# Patient Record
Sex: Female | Born: 1965 | Race: White | Hispanic: No | Marital: Married | Smoking: Former smoker
Health system: Southern US, Community
[De-identification: ages and names within clinical notes are randomized; demographics above are authoritative.]

## PROBLEM LIST (undated history)

## (undated) DIAGNOSIS — G8929 Other chronic pain: Secondary | ICD-10-CM

## (undated) DIAGNOSIS — Z8489 Family history of other specified conditions: Secondary | ICD-10-CM

## (undated) DIAGNOSIS — M549 Dorsalgia, unspecified: Secondary | ICD-10-CM

## (undated) DIAGNOSIS — M1990 Unspecified osteoarthritis, unspecified site: Secondary | ICD-10-CM

## (undated) HISTORY — PX: TONSILLECTOMY: SUR1361

## (undated) HISTORY — PX: OTHER SURGICAL HISTORY: SHX169

## (undated) HISTORY — PX: TONSILECTOMY, ADENOIDECTOMY, BILATERAL MYRINGOTOMY AND TUBES: SHX2538

---

## 2005-05-07 ENCOUNTER — Ambulatory Visit: Payer: Self-pay | Admitting: Obstetrics and Gynecology

## 2005-11-21 ENCOUNTER — Emergency Department: Payer: Self-pay | Admitting: Emergency Medicine

## 2007-05-25 ENCOUNTER — Ambulatory Visit: Payer: Self-pay | Admitting: Obstetrics and Gynecology

## 2007-05-31 ENCOUNTER — Ambulatory Visit: Payer: Self-pay | Admitting: Obstetrics and Gynecology

## 2007-09-30 ENCOUNTER — Ambulatory Visit: Payer: Self-pay | Admitting: Obstetrics and Gynecology

## 2008-01-29 ENCOUNTER — Emergency Department: Payer: Self-pay | Admitting: Emergency Medicine

## 2009-02-01 ENCOUNTER — Ambulatory Visit: Payer: Self-pay | Admitting: Obstetrics and Gynecology

## 2010-03-14 ENCOUNTER — Ambulatory Visit: Payer: Self-pay | Admitting: Obstetrics and Gynecology

## 2011-03-19 ENCOUNTER — Ambulatory Visit: Payer: Self-pay | Admitting: Obstetrics & Gynecology

## 2014-12-28 ENCOUNTER — Emergency Department
Admission: EM | Admit: 2014-12-28 | Discharge: 2014-12-28 | Disposition: A | Discharge status: Home / Self Care | Payer: 59 | Attending: Emergency Medicine | Admitting: Emergency Medicine

## 2014-12-28 ENCOUNTER — Encounter: Payer: Self-pay | Admitting: Urgent Care

## 2014-12-28 DIAGNOSIS — Z720 Tobacco use: Secondary | ICD-10-CM | POA: Insufficient documentation

## 2014-12-28 DIAGNOSIS — H9201 Otalgia, right ear: Secondary | ICD-10-CM | POA: Diagnosis present

## 2014-12-28 DIAGNOSIS — H6091 Unspecified otitis externa, right ear: Secondary | ICD-10-CM | POA: Diagnosis not present

## 2014-12-28 MED ORDER — NEOMYCIN-POLYMYXIN-HC 1 % OT SOLN: 3.0000 [drp] | Freq: Four times a day (QID) | OTIC | Status: DC

## 2014-12-28 NOTE — Discharge Instructions (Signed)

## 2014-12-28 NOTE — ED Notes (Signed)
Patient presents with a RIGHT side otalgia since yesterday. Has been experiencing a "sloshing" sound in her ear since Saturday. Patient "did a lot of ocean and pool swimming in [place]" on Thursday - attributes her symptoms to this activity. Denies fever and drainage from ear.

## 2014-12-28 NOTE — ED Provider Notes (Signed)
[facility] Emergency Department Provider Note  ____________________________________________  Time seen: Approximately 9:33 PM  I have reviewed the triage vital signs and the nursing notes.   HISTORY  Chief Complaint Otalgia    HPI Summit Borchardt is a 49 y.o. female resistance to the emergency department complaining of right sided ear pain for several days. She states that she was in [place] last week and spent a lot of time in the ocean and pull swelling. She states that she initially felt some "sloshing" sensation in her right ear. She thought that she had thoroughly dried out her ear using hydrogen peroxide, and Q-tips. She states that the pain initially began 3 days ago, resolved, and then returned yesterday. Patient reports the pain has been increasing today. Patient denies any other symptoms.   History reviewed. No pertinent past medical history.  There are no active problems to display for this patient.   No past surgical history on file.  Current Outpatient Rx  Name  Route  Sig  Dispense  Refill  . NEOMYCIN-POLYMYXIN-HYDROCORTISONE (CORTISPORIN) 1 % SOLN otic solution   Right Ear   Place 3 drops into the right ear 4 (four) times daily.   10 mL   0     Allergies Review of patient's allergies indicates not on file.  No family history on file.  Social History Social History  Substance Use Topics  . Smoking status: Current Every Day Smoker  . Smokeless tobacco: None  . Alcohol Use: Yes    Review of Systems Constitutional: No fever/chills Eyes: No visual changes. ENT: No sore throat. Patient endorses right ear pain. Cardiovascular: Denies chest pain. Respiratory: Denies shortness of breath. Gastrointestinal: No abdominal pain.  No nausea, no vomiting.  No diarrhea.  No constipation. Genitourinary: Negative for dysuria. Musculoskeletal: Negative for back pain. Skin: Negative for rash. Neurological: Negative for headaches, focal  weakness or numbness.  10-point ROS otherwise negative.  ____________________________________________   PHYSICAL EXAM:  VITAL SIGNS: ED Triage Vitals  Enc Vitals Group     BP 12/28/14 2130 127/75 mmHg     Pulse --      Resp 12/28/14 2130 16     Temp 12/28/14 2130 97.4 F (36.3 C)     Temp Source 12/28/14 2130 Oral     SpO2 12/28/14 2130 97 %     Weight 12/28/14 2130 150 lb (68.04 kg)     Height 12/28/14 2130  (1.676 m)     Head Cir --      Peak Flow --      Pain Score 12/28/14 2132 5     Pain Loc --      Pain Edu? --      Excl. in GC? --     Constitutional: Alert and oriented. Well appearing and in no acute distress. Eyes: Conjunctivae are normal. PERRL. EOMI. Head: Atraumatic. Nose: No congestion/rhinnorhea. Ears: Left external ear with no abnormality. Left external ear canal with no erythema or edema. Left TM with no bulging and landmarks visible. Right external ear tenderness to palpation of the tragus. Right external auditory canal erythematous and edematous. Mild cerumen noted but not impacted. TM visible beyond cerumen with no signs of erythema, bulging, and landmarks are visible. Mouth/Throat: Mucous membranes are moist.  Oropharynx non-erythematous. Neck: No stridor.   Cardiovascular: Normal rate, regular rhythm. Grossly normal heart sounds.  Good peripheral circulation. Respiratory: Normal respiratory effort.  No retractions. Lungs CTAB. Gastrointestinal: Soft and nontender. No distention. No abdominal  bruits. No CVA tenderness. Musculoskeletal: No lower extremity tenderness nor edema.  No joint effusions. Neurologic:  Normal speech and language. No gross focal neurologic deficits are appreciated. No gait instability. Skin:  Skin is warm, dry and intact. No rash noted. Psychiatric: Mood and affect are normal. Speech and behavior are normal.  ____________________________________________   LABS (all labs ordered are listed, but only abnormal results are  displayed)  Labs Reviewed - No data to display ____________________________________________  EKG   ____________________________________________  RADIOLOGY   ____________________________________________   PROCEDURES  Procedure(s) performed: None  Critical Care performed: No  ____________________________________________   INITIAL IMPRESSION / ASSESSMENT AND PLAN / ED COURSE  Pertinent labs & imaging results that were available during my care of the patient were reviewed by me and considered in my medical decision making (see chart for details).  Patient's history, symptoms, and exam most consistent with otitis externa. Explained to patient diagnosis. She verbalizes understanding. Explained treatment course to include antibiotic eardrops, Tylenol, ibuprofen, acetic acid. Advised patient against using any Q-tips in the ear. Patient verbalizes understanding of same and verbalizes compliance. Patient will follow up with primary care for any continued symptoms past treatment course. ____________________________________________   FINAL CLINICAL IMPRESSION(S) / ED DIAGNOSES  Final diagnoses:  Otitis externa, right      Racheal Patches, PA-C 12/28/14 2154  Emily Filbert, MD 12/28/14 2206

## 2015-05-27 ENCOUNTER — Encounter (HOSPITAL_COMMUNITY): Payer: Self-pay

## 2015-05-27 ENCOUNTER — Emergency Department (HOSPITAL_COMMUNITY)
Admission: EM | Admit: 2015-05-27 | Discharge: 2015-05-27 | Disposition: A | Discharge status: Home / Self Care | Payer: Managed Care, Other (non HMO) | Attending: Emergency Medicine | Admitting: Emergency Medicine

## 2015-05-27 DIAGNOSIS — Z792 Long term (current) use of antibiotics: Secondary | ICD-10-CM | POA: Insufficient documentation

## 2015-05-27 DIAGNOSIS — G8929 Other chronic pain: Secondary | ICD-10-CM | POA: Insufficient documentation

## 2015-05-27 DIAGNOSIS — Z87891 Personal history of nicotine dependence: Secondary | ICD-10-CM | POA: Diagnosis not present

## 2015-05-27 DIAGNOSIS — M62830 Muscle spasm of back: Secondary | ICD-10-CM | POA: Insufficient documentation

## 2015-05-27 DIAGNOSIS — R2689 Other abnormalities of gait and mobility: Secondary | ICD-10-CM | POA: Diagnosis not present

## 2015-05-27 DIAGNOSIS — M545 Low back pain: Secondary | ICD-10-CM | POA: Diagnosis present

## 2015-05-27 HISTORY — DX: Dorsalgia, unspecified: M54.9

## 2015-05-27 HISTORY — DX: Other chronic pain: G89.29

## 2015-05-27 MED ORDER — DIAZEPAM 5 MG PO TABS: 5.0000 mg | ORAL_TABLET | Freq: Two times a day (BID) | ORAL | Status: DC | PRN

## 2015-05-27 MED ORDER — DIAZEPAM 5 MG PO TABS
5.0000 mg | ORAL_TABLET | Freq: Once | ORAL | Status: AC
  Administered 2015-05-27: 5 mg via ORAL
  Filled 2015-05-27: qty 1

## 2015-05-27 MED ORDER — KETOROLAC TROMETHAMINE 30 MG/ML IJ SOLN
30.0000 mg | Freq: Once | INTRAMUSCULAR | Status: AC
  Administered 2015-05-27: 30 mg via INTRAMUSCULAR
  Filled 2015-05-27: qty 1

## 2015-05-27 MED ORDER — KETOROLAC TROMETHAMINE 30 MG/ML IJ SOLN: 30.0000 mg | Freq: Once | INTRAMUSCULAR | Status: DC

## 2015-05-27 MED ORDER — OXYCODONE-ACETAMINOPHEN 5-325 MG PO TABS
1.0000 | ORAL_TABLET | Freq: Once | ORAL | Status: AC
  Administered 2015-05-27: 1 via ORAL

## 2015-05-27 MED ORDER — MORPHINE SULFATE (PF) 4 MG/ML IV SOLN
4.0000 mg | Freq: Once | INTRAVENOUS | Status: AC
  Administered 2015-05-27: 4 mg via INTRAMUSCULAR
  Filled 2015-05-27: qty 1

## 2015-05-27 MED ORDER — OXYCODONE-ACETAMINOPHEN 5-325 MG PO TABS
ORAL_TABLET | ORAL | Status: AC
  Filled 2015-05-27: qty 1

## 2015-05-27 NOTE — ED Notes (Signed)
Patient left at this time with all belongings. 

## 2015-05-27 NOTE — Discharge Instructions (Signed)
You were seen in the ED for low back pain.  Your neurologic exam was normal.  Your back exam showed increased tonicity of your lumbar paraspinal musculature, suggestive of muscle spasm.  You are being discharged with antispasmodic medication.  Please take this medication with caution, as it can be sedating.  Do not drink alcohol or drive/ operate heavy machinery while taking this medication.  You should continue to be as mobile as possible.  This will reduce the duration of your back spasm.  You voiced concern regarding possible herniated disc.  The treatment for that would be Physical therapy and Nonsteroidal anti-inflammatory medications or elective surgery if appropriate.  You should establish with a primary care provider if you are interested in elective surgery or back injections.  They will be able to refer you to an orthopedist or neurosurgeon.     Back Exercises If you have pain in your back, do these exercises 2-3 times each day or as told by your doctor. When the pain goes away, do the exercises once each day, but repeat the steps more times for each exercise (do more repetitions). If you do not have pain in your back, do these exercises once each day or as told by your doctor. EXERCISES Single Knee to Chest Do these steps 3-5 times in a row for each leg:  Lie on your back on a firm bed or the floor with your legs stretched out.  Bring one knee to your chest.  Hold your knee to your chest by grabbing your knee or thigh.  Pull on your knee until you feel a gentle stretch in your lower back.  Keep doing the stretch for 10-30 seconds.  Slowly let go of your leg and straighten it. Pelvic Tilt Do these steps 5-10 times in a row:  Lie on your back on a firm bed or the floor with your legs stretched out.  Bend your knees so they point up to the ceiling. Your feet should be flat on the floor.  Tighten your lower belly (abdomen) muscles to press your lower back against the floor. This  will make your tailbone point up to the ceiling instead of pointing down to your feet or the floor.  Stay in this position for 5-10 seconds while you gently tighten your muscles and breathe evenly. Cat-Cow Do these steps until your lower back bends more easily:  Get on your hands and knees on a firm surface. Keep your hands under your shoulders, and keep your knees under your hips. You may put padding under your knees.  Let your head hang down, and make your tailbone point down to the floor so your lower back is round like the back of a cat.  Stay in this position for 5 seconds.  Slowly lift your head and make your tailbone point up to the ceiling so your back hangs low (sags) like the back of a cow.  Stay in this position for 5 seconds. Press-Ups Do these steps 5-10 times in a row:  Lie on your belly (face-down) on the floor.  Place your hands near your head, about shoulder-width apart.  While you keep your back relaxed and keep your hips on the floor, slowly straighten your arms to raise the top half of your body and lift your shoulders. Do not use your back muscles. To make yourself more comfortable, you may change where you place your hands.  Stay in this position for 5 seconds.  Slowly return to lying  flat on the floor. Bridges Do these steps 10 times in a row:  Lie on your back on a firm surface.  Bend your knees so they point up to the ceiling. Your feet should be flat on the floor.  Tighten your butt muscles and lift your butt off of the floor until your waist is almost as high as your knees. If you do not feel the muscles working in your butt and the back of your thighs, slide your feet 1-2 inches farther away from your butt.  Stay in this position for 3-5 seconds.  Slowly lower your butt to the floor, and let your butt muscles relax. If this exercise is too easy, try doing it with your arms crossed over your chest. Belly Crunches Do these steps 5-10 times in a  row:  Lie on your back on a firm bed or the floor with your legs stretched out.  Bend your knees so they point up to the ceiling. Your feet should be flat on the floor.  Cross your arms over your chest.  Tip your chin a little bit toward your chest but do not bend your neck.  Tighten your belly muscles and slowly raise your chest just enough to lift your shoulder blades a tiny bit off of the floor.  Slowly lower your chest and your head to the floor. Back Lifts Do these steps 5-10 times in a row: 1. Lie on your belly (face-down) with your arms at your sides, and rest your forehead on the floor. 2. Tighten the muscles in your legs and your butt. 3. Slowly lift your chest off of the floor while you keep your hips on the floor. Keep the back of your head in line with the curve in your back. Look at the floor while you do this. 4. Stay in this position for 3-5 seconds. 5. Slowly lower your chest and your face to the floor. GET HELP IF:  Your back pain gets a lot worse when you do an exercise.  Your back pain does not lessen 2 hours after you exercise. If you have any of these problems, stop doing the exercises. Do not do them again unless your doctor says it is okay. GET HELP RIGHT AWAY IF:  You have sudden, very bad back pain. If this happens, stop doing the exercises. Do not do them again unless your doctor says it is okay.   This information is not intended to replace advice given to you by your health care provider. Make sure you discuss any questions you have with your health care provider.   Document Released: 04/20/2010 Document Revised: 12/07/2014 Document Reviewed: 05/12/2014 Elsevier Interactive Patient Education 2016 [place] Injury Prevention Back injuries can be very painful. They can also be difficult to heal. After having one back injury, you are more likely to injure your back again. It is important to learn how to avoid injuring or re-injuring your back.  The following tips can help you to prevent a back injury. WHAT SHOULD I KNOW ABOUT PHYSICAL FITNESS?  Exercise for 30 minutes per day on most days of the week or as told by your doctor. Make sure to:  Do aerobic exercises, such as walking, jogging, biking, or swimming.  Do exercises that increase balance and strength, such as tai chi and yoga.  Do stretching exercises. This helps with flexibility.  Try to develop strong belly (abdominal) muscles. Your belly muscles help to support your back.  Stay at  a healthy weight. This helps to decrease your risk of a back injury. WHAT SHOULD I KNOW ABOUT MY DIET?  Talk with your doctor about your overall diet. Take supplements and vitamins only as told by your doctor.  Talk with your doctor about how much calcium and vitamin D you need each day. These nutrients help to prevent weakening of the bones (osteoporosis).  Include good sources of calcium in your diet, such as:  Dairy products.  Green leafy vegetables.  Products that have had calcium added to them (fortified).  Include good sources of vitamin D in your diet, such as:  Milk.  Foods that have had vitamin D added to them. WHAT SHOULD I KNOW ABOUT MY POSTURE?  Sit up straight and stand up straight. Avoid leaning forward when you sit or hunching over when you stand.  Choose chairs that have good low-back (lumbar) support.  If you work at a desk, sit close to it so you do not need to lean over. Keep your chin tucked in. Keep your neck drawn back. Keep your elbows bent so your arms look like the letter "L" (right angle).  Sit high and close to the steering wheel when you drive. Add a low-back support to your car seat, if needed.  Avoid sitting or standing in one position for very long. Take breaks to get up, stretch, and walk around at least one time every hour. Take breaks every hour if you are driving for long periods of time.  Sleep on your side with your knees slightly bent, or  sleep on your back with a pillow under your knees. Do not lie on the front of your body to sleep. WHAT SHOULD I KNOW ABOUT LIFTING, TWISTING, AND REACHING Lifting and Heavy Lifting  Avoid heavy lifting, especially lifting over and over again. If you must do heavy lifting:  Stretch before lifting.  Work slowly.  Rest between lifts.  Use a tool such as a cart or a dolly to move objects if one is available.  Make several small trips instead of carrying one heavy load.  Ask for help when you need it, especially when moving big objects.  Follow these steps when lifting:  Stand with your feet shoulder-width apart.  Get as close to the object as you can. Do not pick up a heavy object that is far from your body.  Use handles or lifting straps if they are available.  Bend at your knees. Squat down, but keep your heels off the floor.  Keep your shoulders back. Keep your chin tucked in. Keep your back straight.  Lift the object slowly while you tighten the muscles in your legs, belly, and butt. Keep the object as close to the center of your body as possible.  Follow these steps when putting down a heavy load:  Stand with your feet shoulder-width apart.  Lower the object slowly while you tighten the muscles in your legs, belly, and butt. Keep the object as close to the center of your body as possible.  Keep your shoulders back. Keep your chin tucked in. Keep your back straight.  Bend at your knees. Squat down, but keep your heels off the floor.  Use handles or lifting straps if they are available. Twisting and Reaching  Avoid lifting heavy objects above your waist.  Do not twist at your waist while you are lifting or carrying a load. If you need to turn, move your feet.  Do not bend over without bending  at your knees.  Avoid reaching over your head, across a table, or for an object on a high surface.  WHAT ARE SOME OTHER TIPS?  Avoid wet floors and icy ground. Keep  sidewalks clear of ice to prevent falls.   Do not sleep on a mattress that is too soft or too hard.   Keep items that you use often within easy reach.   Put heavier objects on shelves at waist level, and put lighter objects on lower or higher shelves.  Find ways to lower your stress, such as:  Exercise.  Massage.  Relaxation techniques.  Talk with your doctor if you feel anxious or depressed. These conditions can make back pain worse.  Wear flat heel shoes with cushioned soles.  Avoid making quick (sudden) movements.  Use both shoulder straps when carrying a backpack.  Do not use any tobacco products, including cigarettes, chewing tobacco, or electronic cigarettes. If you need help quitting, ask your doctor.   This information is not intended to replace advice given to you by your health care provider. Make sure you discuss any questions you have with your health care provider.   Document Released: 09/04/2007 Document Revised: 08/02/2014 Document Reviewed: 03/22/2014 Elsevier Interactive Patient Education 2016 [place] therapy can help ease sore, stiff, injured, and tight muscles and joints. Heat relaxes your muscles, which may help ease your pain. Heat therapy should only be used on old, pre-existing, or long-lasting (chronic) injuries. Do not use heat therapy unless told by your doctor. HOW TO USE HEAT THERAPY There are several different kinds of heat therapy, including:  Moist heat pack.  Warm water bath.  Hot water bottle.  Electric heating pad.  Heated gel pack.  Heated wrap.  Electric heating pad. GENERAL HEAT THERAPY RECOMMENDATIONS   Do not sleep while using heat therapy. Only use heat therapy while you are awake.  Your skin may turn pink while using heat therapy. Do not use heat therapy if your skin turns red.  Do not use heat therapy if you have new pain.  High heat or long exposure to heat can cause burns. Be careful when  using heat therapy to avoid burning your skin.  Do not use heat therapy on areas of your skin that are already irritated, such as with a rash or sunburn. GET HELP IF:   You have blisters, redness, swelling (puffiness), or numbness.  You have new pain.  Your pain is worse. MAKE SURE YOU:  Understand these instructions.  Will watch your condition.  Will get help right away if you are not doing well or get worse.   This information is not intended to replace advice given to you by your health care provider. Make sure you discuss any questions you have with your health care provider.   Document Released: 06/10/2011 Document Revised: 04/08/2014 Document Reviewed: 05/11/2013 Elsevier Interactive Patient Education 2016 [employer].   Emergency Department Resource Guide 1) Find a Doctor and Pay Out of Pocket Although you won't have to find out who is covered by your insurance plan, it is a good idea to ask around and get recommendations. You will then need to call the office and see if the doctor you have chosen will accept you as a new patient and what types of options they offer for patients who are self-pay. Some doctors offer discounts or will set up payment plans for their patients who do not have insurance, but you will need to ask  so you aren't surprised when you get to your appointment.  2) Contact Your Local Health Department Not all health departments have doctors that can see patients for sick visits, but many do, so it is worth a call to see if yours does. If you don't know where your local health department is, you can check in your phone book. The CDC also has a tool to help you locate your state's health department, and many state websites also have listings of all of their local health departments.  3) Find a [facility] If your illness is not likely to be very severe or complicated, you may want to try a walk in clinic. These are popping up all over the country in  pharmacies, drugstores, and shopping centers. They're usually staffed by nurse practitioners or physician assistants that have been trained to treat common illnesses and complaints. They're usually fairly quick and inexpensive. However, if you have serious medical issues or chronic medical problems, these are probably not your best option.  No Primary Care Doctor: - Call Health Connect at  [phone] - they can help you locate a primary care doctor that  accepts your insurance, provides certain services, etc. - Physician Referral Service- [phone]  Chronic Pain Problems: Organization         Address  Phone   Notes  [facility]  [phone] Patients need to be referred by their primary care doctor.   Medication Assistance: Organization         Address  Phone   Notes  [facility] Medication [facility] [place] [place], [place] [address] [phone] --Must be a resident of [facility] -- Must have NO insurance coverage whatsoever (no Medicaid/ Medicare, etc.) -- The pt. MUST have a primary care doctor that directs their care regularly and follows them in the community   [facility]ist  [phone]   [employer]  [phone]    Agencies that provide inexpensive medical care: Organization         Address  Phone   Notes  [place]  [phone]   [facility]    [phone]   [facility] [place], [place] [address] [phone]   [place] [address], [place] [phone]   Planned Parenthood    [phone]   [facility]    [phone]   [place] and [place] [address], [place] Phone:  [phone], Fax:  [phone] Hours of Operation:  9 am - 6 pm, M-F.  Also accepts Medicaid/Medicare and self-pay.  [facility] for [place], [address],  [place] Phone: [phone], Fax: [phone]. Hours of Operation:  8:30 am - 5:30 pm, M-F.  Also accepts Medicaid and self-pay.  [facility] [place] [address], [place] Phone: [phone]   [place], [place], [place] [phone], Ext. [phone] Mondays & Thursdays: 7-9 AM.  First 15 patients are seen on a first come, first serve basis.    [place] Providers:  Organization         Address  Phone   Notes  [facility] [address], [place] [phone] Also accepts self-pay patients.  [place], [place], [place]  [phone]  [place], [address], [place] [phone]   [place] [address] [place] [phone]   Lucianne Lei [address], [place]   [phone] Only accepts [place] Access [place] patients after they have their name applied to their card.   Self-Pay (no insurance) in [facility]:  Organization         Address  Phone   Notes  Sickle Cell Patients, [facility] [place] [phone]   [facility] Care [place] [phone]   [facility] [place]  [place], [place], [place] [phone]   [facility]/Dr. Osei-Bonsu  [address] [place] or [address], [place] [phone] Phone number for both [place] and [place] locations is the same.  Urgent Medical and [facility] [address], [place] [phone]   [facility] [address] [place] or [address] [phone]   [facility] [address] [place] [phone], phone; [phone], fax Sees patients 1st and 3rd Saturday of every month.  Must not  qualify for public or private insurance (i.e. Medicaid, Medicare, [place] Health Choice, Veterans' Benefits)  Household income should be no more than 200% of the poverty level The clinic cannot treat you if you are pregnant or think you are pregnant  Sexually transmitted diseases are not treated at the clinic.    Dental Care: Organization         Address  Phone  Notes  [facility] of [facility] [place] [phone] Accepts children up to age 83 who are enrolled in [place] or [place]; pregnant women with a Medicaid card; and children who have applied for Medicaid or [place] Health Choice, but were declined, whose parents can pay a reduced fee at time of service.  [facility] Department of [facility]  [address], [place] [phone] Accepts children up to age 33 who are enrolled in [place] or [place]; pregnant women with a Medicaid card; and children who have applied for Medicaid or [place] Health Choice, but were declined, whose parents can pay a reduced fee at time of service.  [place] Adult Dental Access PROGRAM  [place] [phone] Patients are seen by appointment only. Walk-ins are not accepted. [place] will see patients 32 years of age and older. Monday - Tuesday (8am-5pm) Most Wednesdays (8:30-5pm) $30 per visit, cash only  [facility] Adult Dental Access PROGRAM  [address], [facility] [phone] Patients are seen by appointment only. Walk-ins are not accepted. [place] will see patients 66 years of age and older. One Wednesday Evening (Monthly: Volunteer Based).  $30 per visit, cash only  [place]  [phone] for adults; Children under age 33, call Graduate Pediatric Dentistry at [phone]. Children aged 38-14, please call [phone] to request a pediatric application.  Dental services are provided  in all areas of dental care including fillings, crowns and bridges, complete and partial dentures, implants, gum treatment, root canals, and extractions. Preventive care is also provided. Treatment is provided to both adults and children. Patients are selected via a lottery and there is often a waiting list.  [facility] [address] [place]  [phone] [email or web address]   [facility] [address] [place], [place] [phone], Ext. [phone] Second and Fourth Thursday of each month, opens at 6:30 AM; Clinic ends at 9 AM.  Patients are seen on a first-come first-served basis, and a limited number are seen during each clinic.   [facility]  [address] Hillard Danker [place], [place] [phone]   Eligibility Requirements You must have lived in [place], [place], or [place] counties for at least the last three months.   You cannot be eligible for state or federal sponsored [employer], including [employer], [place], or [employer].   You generally cannot be eligible for healthcare insurance through your employer.    How to apply: Eligibility screenings are held every Tuesday and Wednesday afternoon from 1:00 pm until 4:00 pm. You do not need an appointment for the interview!  [facility] [address], [place], [place]   [place]  [place] Department  [place]  [phone]    Behavioral Health Resources in the Community: Intensive Outpatient Programs Organization         Address  Phone  Notes  [place]. [address] [place], [place] [phone]   [facility] Outpatient [address] [place], [place]   ADS: Alcohol & Drug Svcs [address] [place], [place]   [place] [address]  [place], [place] or [phone]   Substance Abuse Resources Organization         Address  Phone  Notes  Alcohol and Drug Services  [phone]   [place]  [phone]   [facility] [place]   Chinita Pester  [phone]   Residential & Outpatient Substance Abuse Program  [phone]   Psychological Services Organization         Address  Phone  Notes  [facility] [place]  [place]  [phone]   [place] [address], [place] or [phone]    Mobile Crisis Teams Organization         Address  Phone  Notes  [facility], Mobile Crisis Care Unit  [phone]   [facility]  [address] [place], [place]   [place] [address] [place] [phone]    Self-Help/Support Groups Organization         Address  Phone             Notes  [place]. of [place] - variety of support groups  [place] Call for more information  Narcotics Anonymous (NA), [facility] [address], [employer] [place]  2 meetings at this location   Special educational needs teacher         Address  Phone  Notes  [facility] Residential Treatment [place],    [employer]  [phone]   [facility]  [address], [place] [address], [place], [place]   [place], [place] [phone] Admissions: 8am-3pm M-F  Incentives Substance [place] [place] [address]    [place], [place] [phone]   [facility] [address] [place], [place], [place]   [place].,  [place], [place]   [facility] - Intensive Outpatient [address] Kristeen [address], [place], [place]   [facility] ([place] [address]  [place], [place] [phone] or  [phone]   [facility] Services (RTS) [address] [place], [place] Accepts Medicaid  [facility] [place] [address],  [place] [phone] Substance Abuse/Addiction Treatment   [facility] Organization         Address  Phone  Notes  [facility]  [phone]   Domenic Schwab, PhD [address] Arlis [place], [place]   [phone] or [phone]   [place], [place] [phone]   [facility] [address], [place], [place] [phone] Insurance/Medicaid/sponsorship through [facility] and Families [address] [place]                                    [place], [place] [phone] [place] [address], [place] [phone]    Dr. Adele Schilder  [phone]   [facility] Dept. 1) [address] [place] 2) [place] 3)  [address], [place] [phone]  [phone]   [place] [phone] or [phone] (After Hours)

## 2015-05-27 NOTE — ED Notes (Signed)
Pt here for non-radiating intense lower left back pain/spasms, onset yesterday morning. She has chronic back pain but this feels different to her. Denies urinary symptoms.

## 2015-05-27 NOTE — ED Provider Notes (Signed)
CSN: [id number]     Arrival date & time 05/27/15  1749 History   First MD Initiated Contact with Patient 05/27/15 1841     Chief Complaint  Patient presents with  . Back Pain    HPI Patient reports that she has had a long standing history of back pain.  She notes that pain started in her 15's.  There was no instigating injury.  She has intermittent back pain ever since.  She reports that yesterday, she started having back spasms that seem much different than her typical back pain.  Spasm is worsened by trying to use her LLE.  Spasm is in her Low back.  She notes that she has taken 2 doses of Flexeril without much relief.  She reports that the Percocet that was given to her in the waiting room has helped some but she thinks mostly because it has made her sleepy.  She notes that she has used cold compresses, TENS unit, NSAIDs with no relief.  Denies numbness, tingling, falls, LE weakness, urinary retention, fecal incontinence.  She was able to ambulate independently earlier today but now is requiring assistance to walk 2/2 pain.  Past Medical History  Diagnosis Date  . Chronic back pain    History reviewed. No pertinent past surgical history. No family history on file. Social History  Substance Use Topics  . Smoking status: Former Games developer  . Smokeless tobacco: None  . Alcohol Use: Yes   OB History    No data available     Review of Systems  Constitutional: Negative for fever, chills and fatigue.  HENT: Negative for congestion, rhinorrhea and sore throat.   Eyes: Negative for photophobia and visual disturbance.  Respiratory: Negative for cough and shortness of breath.   Cardiovascular: Negative for chest pain.  Gastrointestinal: Negative for nausea, vomiting and diarrhea.  Genitourinary: Negative for difficulty urinating.  Musculoskeletal: Positive for back pain (low back) and gait problem (secondary to pain).  Skin: Negative for rash.  Neurological: Negative for dizziness, weakness,  numbness and headaches.    Allergies  Review of patient's allergies indicates no known allergies.  Home Medications   Prior to Admission medications   Medication Sig Start Date End Date Taking? Authorizing Provider  NEOMYCIN-POLYMYXIN-HYDROCORTISONE (CORTISPORIN) 1 % SOLN otic solution Place 3 drops into the right ear 4 (four) times daily. 12/28/14   Delorise Royals Cuthriell, PA-C   BP 129/94 mmHg  Pulse 103  Temp(Src) 97.9 F (36.6 C) (Oral)  Resp 20  SpO2 100% Physical Exam  Constitutional: She is oriented to person, place, and time. She appears well-developed and well-nourished. No distress.  HENT:  Head: Normocephalic and atraumatic.  Mouth/Throat: Oropharynx is clear and moist.  Eyes: EOM are normal. Pupils are equal, round, and reactive to light.  Neck: Normal range of motion. Neck supple.  Cardiovascular: Normal rate, regular rhythm, normal heart sounds and intact distal pulses.   No murmur heard. Pulmonary/Chest: Effort normal and breath sounds normal. No respiratory distress.  Abdominal: Soft. Bowel sounds are normal. There is no tenderness.  Musculoskeletal: She exhibits no tenderness.  Slow movement of LE secondary to pain.  Negative straight leg test bilaterally.  No midline TTP to C, T, L spine.  No paraspinal TTP to C, T, or L spine.  5/5 LE strength bilaterally.  No saddle anesthesia.  Normal light touch sensation of LE bilaterally.  No clonus.  Neurological: She is alert and oriented to person, place, and time. She exhibits abnormal muscle tone (  increased tone of lumbar paraspinal muscles).  Skin: Skin is warm and dry. No rash noted. She is not diaphoretic.  Psychiatric: She has a normal mood and affect. Her behavior is normal. Judgment and thought content normal.    ED Course  Procedures (including critical care time) Labs Review Labs Reviewed - No data to display  Imaging Review No results found. I have personally reviewed and evaluated these images and lab  results as part of my medical decision-making.   EKG Interpretation None      MDM   Final diagnoses:  None    1801: Patient given 1 Percocet .  Reviewed 2014 MRI back from [facility] in care everywhere.  2000: Valium , Toradol  IM ordered x1.  No evidence of cauda equina.  No LE weakness.  No injury/ bony abnormalities on exam.    2025:  Persistent pain.  Morphine IM  x1 ordered.  Wylma Tatem is a 50 y.o. female that presents to ED with exacerbation of chronic pain.  No inciting injury.  No neurologic deficits.  No evidence of cauda equina.  No surgical emergency identified.  Suspect muscle spasm.  Patient would benefit from establishing with orthopedist vs neurosurgeon for back injections vs consideration for elective back surgery.  Would also benefit from outpatient PT for strengthening of core.  Patient given multiple medications in ED.  She was discharged with small course of Valium  and instructed to continue NSAIDs.  Recommended early mobility to reduce duration of back spasm.  Return precautions reviewed.  Also recommended that patient establish with PCP so that her chronic back pain can continue to be managed outpatient.  Raliegh Ip, DO 05/27/15 2106  Melene Plan, DO 05/27/15 2125

## 2015-05-27 NOTE — ED Notes (Signed)
Pain medication to be given in triage. Pt agrees to not drive in the next 4 hours

## 2015-05-31 ENCOUNTER — Other Ambulatory Visit: Payer: Self-pay | Admitting: Orthopaedic Surgery

## 2015-05-31 DIAGNOSIS — M545 Low back pain: Secondary | ICD-10-CM

## 2015-06-10 ENCOUNTER — Ambulatory Visit
Admission: RE | Admit: 2015-06-10 | Discharge: 2015-06-10 | Disposition: A | Discharge status: Home / Self Care | Payer: Managed Care, Other (non HMO) | Source: Ambulatory Visit | Attending: Orthopaedic Surgery | Admitting: Orthopaedic Surgery

## 2015-06-10 DIAGNOSIS — M545 Low back pain: Secondary | ICD-10-CM

## 2016-10-16 ENCOUNTER — Encounter: Payer: Self-pay | Admitting: Obstetrics and Gynecology

## 2016-10-16 ENCOUNTER — Ambulatory Visit (INDEPENDENT_AMBULATORY_CARE_PROVIDER_SITE_OTHER): Payer: 59 | Admitting: Obstetrics and Gynecology

## 2016-10-16 VITALS — BP 110/76 | HR 113 | Ht 66.0 in | Wt 164.0 lb

## 2016-10-16 DIAGNOSIS — Z124 Encounter for screening for malignant neoplasm of cervix: Secondary | ICD-10-CM

## 2016-10-16 DIAGNOSIS — R232 Flushing: Secondary | ICD-10-CM | POA: Diagnosis not present

## 2016-10-16 DIAGNOSIS — Z1211 Encounter for screening for malignant neoplasm of colon: Secondary | ICD-10-CM

## 2016-10-16 DIAGNOSIS — Z01419 Encounter for gynecological examination (general) (routine) without abnormal findings: Secondary | ICD-10-CM

## 2016-10-16 DIAGNOSIS — Z1231 Encounter for screening mammogram for malignant neoplasm of breast: Secondary | ICD-10-CM | POA: Diagnosis not present

## 2016-10-16 DIAGNOSIS — Z1239 Encounter for other screening for malignant neoplasm of breast: Secondary | ICD-10-CM

## 2016-10-16 MED ORDER — PAROXETINE MESYLATE 7.5 MG PO CAPS: 1.0000 | ORAL_CAPSULE | Freq: Every day | ORAL | 11 refills | Status: DC

## 2016-10-16 NOTE — Patient Instructions (Signed)
Preventive Care 40-64 Years, Female Preventive care refers to lifestyle choices and visits with your health care provider that can promote health and wellness. What does preventive care include?  A yearly physical exam. This is also called an annual well check.  Dental exams once or twice a year.  Routine eye exams. Ask your health care provider how often you should have your eyes checked.  Personal lifestyle choices, including: ? Daily care of your teeth and gums. ? Regular physical activity. ? Eating a healthy diet. ? Avoiding tobacco and drug use. ? Limiting alcohol use. ? Practicing safe sex. ? Taking low-dose aspirin daily starting at age 58. ? Taking vitamin and mineral supplements as recommended by your health care provider. What happens during an annual well check? The services and screenings done by your health care provider during your annual well check will depend on your age, overall health, lifestyle risk factors, and family history of disease. Counseling Your health care provider may ask you questions about your:  Alcohol use.  Tobacco use.  Drug use.  Emotional well-being.  Home and relationship well-being.  Sexual activity.  Eating habits.  Work and work Statistician.  Method of birth control.  Menstrual cycle.  Pregnancy history.  Screening You may have the following tests or measurements:  Height, weight, and BMI.  Blood pressure.  Lipid and cholesterol levels. These may be checked every 5 years, or more frequently if you are over 81 years old.  Skin check.  Lung cancer screening. You may have this screening every year starting at age 78 if you have a 30-pack-year history of smoking and currently smoke or have quit within the past 15 years.  Fecal occult blood test (FOBT) of the stool. You may have this test every year starting at age 65.  Flexible sigmoidoscopy or colonoscopy. You may have a sigmoidoscopy every 5 years or a colonoscopy  every 10 years starting at age 30.  Hepatitis C blood test.  Hepatitis B blood test.  Sexually transmitted disease (STD) testing.  Diabetes screening. This is done by checking your blood sugar (glucose) after you have not eaten for a while (fasting). You may have this done every 1-3 years.  Mammogram. This may be done every 1-2 years. Talk to your health care provider about when you should start having regular mammograms. This may depend on whether you have a family history of breast cancer.  BRCA-related cancer screening. This may be done if you have a family history of breast, ovarian, tubal, or peritoneal cancers.  Pelvic exam and Pap test. This may be done every 3 years starting at age 80. Starting at age 36, this may be done every 5 years if you have a Pap test in combination with an HPV test.  Bone density scan. This is done to screen for osteoporosis. You may have this scan if you are at high risk for osteoporosis.  Discuss your test results, treatment options, and if necessary, the need for more tests with your health care provider. Vaccines Your health care provider may recommend certain vaccines, such as:  Influenza vaccine. This is recommended every year.  Tetanus, diphtheria, and acellular pertussis (Tdap, Td) vaccine. You may need a Td booster every 10 years.  Varicella vaccine. You may need this if you have not been vaccinated.  Zoster vaccine. You may need this after age 5.  Measles, mumps, and rubella (MMR) vaccine. You may need at least one dose of MMR if you were born in  1957 or later. You may also need a second dose.  Pneumococcal 13-valent conjugate (PCV13) vaccine. You may need this if you have certain conditions and were not previously vaccinated.  Pneumococcal polysaccharide (PPSV23) vaccine. You may need one or two doses if you smoke cigarettes or if you have certain conditions.  Meningococcal vaccine. You may need this if you have certain  conditions.  Hepatitis A vaccine. You may need this if you have certain conditions or if you travel or work in places where you may be exposed to hepatitis A.  Hepatitis B vaccine. You may need this if you have certain conditions or if you travel or work in places where you may be exposed to hepatitis B.  Haemophilus influenzae type b (Hib) vaccine. You may need this if you have certain conditions.  Talk to your health care provider about which screenings and vaccines you need and how often you need them. This information is not intended to replace advice given to you by your health care provider. Make sure you discuss any questions you have with your health care provider. Document Released: 04/14/2015 Document Revised: 12/06/2015 Document Reviewed: 01/17/2015 Elsevier Interactive Patient Education  2017 [employer].

## 2016-10-16 NOTE — Progress Notes (Signed)
Patient ID: Angela Humphrey, female   DOB: September 18, 1965, 51 y.o.   MRN: [id number]     Gynecology Annual Exam  PCP: Malachy Mood, MD  Chief Complaint:  Chief Complaint  Patient presents with  . Gynecologic Exam    hot flashes/weight/cramps in calfs/toe cramps    History of Present Illness:Patient is a 51 y.o. No obstetric history on file. presents for annual exam. The patient has no complaints today.   LMP: No LMP recorded. Patient is postmenopausal. No postmenopausal spotting or bleeding  The patient is sexually active. She denies dyspareunia.  The patient does perform self breast exams.  There is no notable family history of breast or ovarian cancer in her family.  The patient wears seatbelts: yes.   The patient has regular exercise: not asked.    The patient denies current symptoms of depression.     Review of Systems: Review of Systems  Constitutional: Negative for chills and fever.  HENT: Negative for congestion.   Respiratory: Negative for cough and shortness of breath.   Cardiovascular: Negative for chest pain and palpitations.  Gastrointestinal: Negative for abdominal pain, constipation, diarrhea, heartburn, nausea and vomiting.  Genitourinary: Negative for dysuria, frequency and urgency.  Skin: Negative for itching and rash.  Neurological: Negative for dizziness and headaches.  Endo/Heme/Allergies: Negative for polydipsia.  Psychiatric/Behavioral: Negative for depression.    Past Medical History:  Past Medical History:  Diagnosis Date  . Chronic back pain     Past Surgical History:  Past Surgical History:  Procedure Laterality Date  . dental implant      Gynecologic History:  No LMP recorded. Patient is postmenopausal. Last Pap: Results were: 02/10/2015 NIL and HR HPV negative  Last mammogram: 02/10/2015 Results were: BI-RAD I Obstetric History: No obstetric history on file.  Family History:  Family History  Problem Relation Age of Onset  . Skin  cancer Father 31  . Cancer Paternal Aunt 67  . Colon cancer Paternal Grandmother 63    Social History:  Social History   Social History  . Marital status: Married    Spouse name: N/A  . Number of children: N/A  . Years of education: N/A   Occupational History  . Not on file.   Social History Main Topics  . Smoking status: Former Research scientist (life sciences)  . Smokeless tobacco: Never Used  . Alcohol use Yes  . Drug use: No  . Sexual activity: Yes    Partners: Male    Birth control/ protection: None   Other Topics Concern  . Not on file   Social History Narrative  . No narrative on file    Allergies:  No Known Allergies  Medications: Prior to Admission medications   Medication Sig Start Date End Date Taking? Authorizing Provider  ibuprofen (ADVIL,MOTRIN) 200 MG tablet Take 800 mg by mouth every 6 (six) hours as needed for moderate pain.   Yes [provider]    Physical Exam Vitals: Blood pressure 110/76, pulse (!) 113, height '5\' 6"'  (1.676 m), weight 164 lb (74.4 kg).  General: NAD HEENT: normocephalic, anicteric Thyroid: no enlargement, no palpable nodules Pulmonary: No increased work of breathing, CTAB Cardiovascular: RRR, distal pulses 2+ Breast: Breast symmetrical, no tenderness, no palpable nodules or masses, no skin or nipple retraction present, no nipple discharge.  No axillary or supraclavicular lymphadenopathy. Abdomen: NABS, soft, non-tender, non-distended.  Umbilicus without lesions.  No hepatomegaly, splenomegaly or masses palpable. No evidence of hernia  Genitourinary:  External: Normal external female genitalia.  Normal urethral meatus, normal  Bartholin's and Skene's glands.    Vagina: Normal vaginal mucosa, no evidence of prolapse.    Cervix: Grossly normal in appearance, no bleeding  Uterus: Non-enlarged, mobile, normal contour.  No CMT  Adnexa: ovaries non-enlarged, no adnexal masses  Rectal: deferred  Lymphatic: no evidence of inguinal  lymphadenopathy Extremities: no edema, erythema, or tenderness Neurologic: Grossly intact Psychiatric: mood appropriate, affect full  Female chaperone present for pelvic and breast  portions of the physical exam    Assessment: 51 y.o. No obstetric history on file. No problem-specific Assessment & Plan notes found for this encounter.   Plan: Problem List Items Addressed This Visit    None    Visit Diagnoses    Vasomotor flushing    -  Primary   Relevant Orders   TSH (Completed)   Screening for malignant neoplasm of cervix       Relevant Orders   PapIG, HPV, rfx 16/18   Breast screening       Relevant Orders   MM DIGITAL SCREENING BILATERAL   Special screening for malignant neoplasms, colon       Relevant Orders   Cologuard   Encounter for gynecological examination without abnormal finding       Relevant Orders   PapIG, HPV, rfx 16/18      1) Mammogram - recommend yearly screening mammogram.  Mammogram Was ordered today - BRCA negative 06/26/2012 - TC lifetime risk 29.3%  2) STI screening was offered and declined  3) ASCCP guidelines and rational discussed.  Patient opts for yearly screening interval  4) Osteoporosis  - per USPTF routine screening DEXA at age 21  5) Routine healthcare maintenance including cholesterol, diabetes screening discussed managed by PCP  6) Colonoscopy Screening recommended starting at age 27 for average risk individuals, age 34 for individuals deemed at increased risk (including African Americans) and recommended to continue until age 22.  For patient age 42-85 individualized approach is recommended.  Gold standard screening is via colonoscopy, Cologuard screening is an acceptable alternative for patient unwilling or unable to undergo colonoscopy.  "Colorectal cancer screening for average?risk adults: 2018 guideline update from the American Cancer Society"CA: A Cancer Journal for Clinicians: Aug 28, 2016  - cologuard was ordered today was due  for repeat colonoscopy this year  7) Problems loosing weight and hot flashes - trial of brisdelle TSH screening  8)Follow up 1 year for routine annual

## 2016-10-17 LAB — TSH: TSH: 1.85 u[IU]/mL (ref 0.450–4.500)

## 2016-10-18 ENCOUNTER — Encounter: Payer: Self-pay | Admitting: Obstetrics and Gynecology

## 2016-10-21 LAB — PAPIG, HPV, RFX 16/18
HPV, HIGH-RISK: NEGATIVE
PAP SMEAR COMMENT: 0

## 2016-10-24 ENCOUNTER — Encounter: Payer: Self-pay | Admitting: Obstetrics and Gynecology

## 2016-10-25 ENCOUNTER — Other Ambulatory Visit: Payer: Self-pay | Admitting: Obstetrics and Gynecology

## 2016-10-25 MED ORDER — PAROXETINE MESYLATE 7.5 MG PO CAPS: 1.0000 | ORAL_CAPSULE | Freq: Every day | ORAL | 11 refills | Status: AC

## 2016-11-07 LAB — COLOGUARD

## 2016-11-12 ENCOUNTER — Telehealth: Payer: Self-pay

## 2016-11-12 NOTE — Telephone Encounter (Signed)
Pt calling for cologard results as they are not in MyChart.  [phone]

## 2016-11-12 NOTE — Telephone Encounter (Signed)
Pt aware negative result.

## 2016-11-19 ENCOUNTER — Encounter: Payer: Self-pay | Admitting: Obstetrics and Gynecology

## 2016-12-25 ENCOUNTER — Ambulatory Visit
Admission: RE | Admit: 2016-12-25 | Discharge: 2016-12-25 | Disposition: A | Discharge status: Home / Self Care | Payer: Managed Care, Other (non HMO) | Source: Ambulatory Visit | Attending: Obstetrics and Gynecology | Admitting: Obstetrics and Gynecology

## 2016-12-25 ENCOUNTER — Other Ambulatory Visit: Payer: Self-pay | Admitting: Obstetrics and Gynecology

## 2016-12-25 DIAGNOSIS — Z1231 Encounter for screening mammogram for malignant neoplasm of breast: Secondary | ICD-10-CM | POA: Insufficient documentation

## 2016-12-25 DIAGNOSIS — Z1239 Encounter for other screening for malignant neoplasm of breast: Secondary | ICD-10-CM

## 2017-01-01 ENCOUNTER — Inpatient Hospital Stay
Admission: RE | Admit: 2017-01-01 | Discharge: 2017-01-01 | Disposition: A | Discharge status: Home / Self Care | Payer: Self-pay | Source: Ambulatory Visit | Attending: *Deleted | Admitting: *Deleted

## 2017-01-01 ENCOUNTER — Encounter: Payer: Self-pay | Admitting: Obstetrics and Gynecology

## 2017-01-01 ENCOUNTER — Other Ambulatory Visit: Payer: Self-pay | Admitting: *Deleted

## 2017-01-01 DIAGNOSIS — Z9289 Personal history of other medical treatment: Secondary | ICD-10-CM

## 2017-01-14 ENCOUNTER — Telehealth: Payer: Self-pay

## 2017-01-14 NOTE — Telephone Encounter (Signed)
[employer] sent over a medication request for Peroxitine. AMS sent it in via request form. KJ CMA

## 2017-02-11 ENCOUNTER — Telehealth (INDEPENDENT_AMBULATORY_CARE_PROVIDER_SITE_OTHER): Payer: Self-pay | Admitting: Orthopaedic Surgery

## 2017-02-11 ENCOUNTER — Telehealth (INDEPENDENT_AMBULATORY_CARE_PROVIDER_SITE_OTHER): Payer: Self-pay | Admitting: Radiology

## 2017-02-11 NOTE — Telephone Encounter (Signed)
I don't think I can refer her for a ESI in [place] since I'm not familiar with anyone out there.  She'll need to establish care with someone out there.

## 2017-02-11 NOTE — Telephone Encounter (Signed)
Patient saw Dr. Roda ShuttersXu for lower back before. She is currently out of [place] wanting to get referred [place] possible [place]ural injection [place]. She is only comfortable laying on the floor. Advised that if she did go to the ER I cannot guarantee what her treatment plan might be there. Last seen on 07/2015. She has had amazing relief with bilateral lumbar facet L4-5 injections with Dr. Alvester [place] and feels she really needs another to even get on the plane. I told her I did not know the likelihood since she has not been seen in some time. But she is desperate for help and some relief and not wanting to spend thousands of dollars for an ER visit that will get her nowhere. Please call back to advise.

## 2017-02-11 NOTE — Telephone Encounter (Signed)
Angela Humphrey spoke with patient.

## 2017-02-11 NOTE — Telephone Encounter (Signed)
See message below °

## 2017-02-11 NOTE — Telephone Encounter (Signed)
Emailed 06/10/2015 MRI report to patient [email or web address]

## 2017-04-08 ENCOUNTER — Telehealth (INDEPENDENT_AMBULATORY_CARE_PROVIDER_SITE_OTHER): Payer: Self-pay | Admitting: Physical Medicine and Rehabilitation

## 2017-04-08 ENCOUNTER — Encounter (INDEPENDENT_AMBULATORY_CARE_PROVIDER_SITE_OTHER): Payer: Self-pay | Admitting: Physical Medicine and Rehabilitation

## 2017-04-08 ENCOUNTER — Ambulatory Visit (INDEPENDENT_AMBULATORY_CARE_PROVIDER_SITE_OTHER): Payer: 59 | Admitting: Physical Medicine and Rehabilitation

## 2017-04-08 VITALS — BP 138/87

## 2017-04-08 DIAGNOSIS — G8929 Other chronic pain: Secondary | ICD-10-CM

## 2017-04-08 DIAGNOSIS — M47816 Spondylosis without myelopathy or radiculopathy, lumbar region: Secondary | ICD-10-CM | POA: Diagnosis not present

## 2017-04-08 DIAGNOSIS — M5136 Other intervertebral disc degeneration, lumbar region: Secondary | ICD-10-CM

## 2017-04-08 DIAGNOSIS — M545 Low back pain: Secondary | ICD-10-CM | POA: Diagnosis not present

## 2017-04-08 MED ORDER — BACLOFEN 10 MG PO TABS: 10.0000 mg | ORAL_TABLET | Freq: Three times a day (TID) | ORAL | 3 refills | Status: DC | PRN

## 2017-04-08 MED ORDER — DIAZEPAM 5 MG PO TABS: ORAL_TABLET | ORAL | 0 refills | Status: DC

## 2017-04-08 NOTE — Progress Notes (Deleted)
Pt states pain in lower back with a burning feeling in both buttocks that comes and goes. Pt states the pain has been going on for about 5 months. Pt states last injection by Dr. Marcial [place] around 07/2015 last for a year and a half. Pt states soaking in tub with epsom salt eases the pain, moving and getting out the bed makes pain worse. Pt states on 02/10/17 [place] and [place] injections and she states immediate relief.

## 2017-04-08 NOTE — Progress Notes (Signed)
Angela AmmonsSharon Humphrey - 52 y.o. female MRN [id number]  Date of birth: 07/15/1965  Office Visit Note: Visit Date: 04/08/2017 PCP: Vena AustriaStaebler, Andreas, MD Referred by: Vena AustriaStaebler, Andreas, MD  Subjective: Chief Complaint  Patient presents with  . Lower Back - Pain   HPI: 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  She initially saw Dr. Roda ShuttersXu in our office for orthopedic care in 2017.  She was referred to me at the time because of ongoing back pain.  We completed bilateral L4-5 facet joint blocks in April 2017 with really good relief of her symptoms.  If the injection itself was very painful and in fact was really out of proportion to what we normally see skin or musculature.  The patient however did well and ultimately got really good relief with the injection itself.  MRI from the time was completed and showed degenerative disc height loss at L3-4 and L4-5 and moderate facet arthropathy at L4-5 without much in the way of nerve compression or stenosis.  This is reviewed below.  The patient went on to have episodic back pain but nothing like she had in the past.  She had one episode in November where the pain was excruciating.  She reports that on February 10, 2017 while in [place] after running her car to drive to [place] she was in incredible state of pain.  This was pain across the low back with inability to stand up straight and can only get relief lying on the floor.  At the time she went to an urgent care and received Toradol and cortisone injections in the musculature and this seemed to help with immediate relief.  Most days she states that soaking in a tub with Epsom salts eases the pain moving it out of bed makes the pain worse.  At work she does use a Market researcherstandup desk but she does sit and stand frequently.  She has never noticed pain shooting down the legs or radicular pain.  She is tried physical therapy but it did  not go well as it seemed to make her pain worse.  The same holds true for massage.  Anything along the musculature or movement of the spine causes her to have fairly violent and exaggerated pain.  She does not carry a diagnosis of fibromyalgia.  She has been using some ibuprofen.  She cannot use meloxicam as it makes her gain weight.  She has had no bowel or bladder symptoms.  She has had no focal weakness.  She has had no fevers chills or night sweats or night pain.    Review of Systems  Constitutional: Negative for chills, fever, malaise/fatigue and weight loss.  HENT: Negative for hearing loss and sinus pain.   Eyes: Negative for blurred vision, double vision and photophobia.  Respiratory: Negative for cough and shortness of breath.   Cardiovascular: Negative for chest pain, palpitations and leg swelling.  Gastrointestinal: Negative for abdominal pain, nausea and vomiting.  Genitourinary: Negative for flank pain.  Musculoskeletal: Positive for back pain. Negative for myalgias.  Skin: Negative for itching and rash.  Neurological: Negative for tremors, focal weakness and weakness.  Endo/Heme/Allergies: Negative.   Psychiatric/Behavioral: Negative for depression. The patient is nervous/anxious.   All other systems reviewed and are negative.  Otherwise per HPI.  Assessment & Plan: Visit Diagnoses: No diagnosis found.  Plan: Findings:  Chronic history of baseline lumbar  spine pain which is centered at the L4-L5 region.  Episodic pain into both buttock regions along the SI joints.  She also has fairly infrequent episodic severe pain which sounds like severe muscle spasm and deep spinal pain.  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  My guess is that she has have some local annular tearing of the disc at times with movement and this causes a subsequent significant increase in ligamentous and muscle spasm.  The prior injection at the L4-5 facet  joints probably alleviated some of the mechanical pain has allowed her to move more freely for close to a year.  It would be nice for her to work on core strengthening to enable her to do her physical work and hobbies that she performs.  She likely would not do well with a lumbar spine fusion just for axial back pain which is sporadic.  She ultimately could be a candidate for spinal cord stimulator trial.  I think the next best approach though is repeating the L4-5 facet joint blocks.  I would give her Valium preprocedure to give her some relaxation.  We are going to try baclofen as a muscle relaxer during the day.  She is used this in the past.  Lastly I would have her regroup with a physical therapist to start slowly with posture and myofascial release.  She likely would not do well with dry needling.  As far as other medications this would not be a situation of chronic opioid use.  She could ultimately benefit from something like Cymbalta or tizanidine.    Meds & Orders:  Meds ordered this encounter  Medications  . diazepam (VALIUM) 5 MG tablet    Sig: Take 1 by mouth 1 to 2 hours pre-procedure. May repeat if necessary.    Dispense:  2 tablet    Refill:  0  . baclofen (LIORESAL) 10 MG tablet    Sig: Take 1 tablet (10 mg total) by mouth every 8 (eight) hours as needed for muscle spasms (Pain).    Dispense:  60 tablet    Refill:  3   No orders of the defined types were placed in this encounter.   Follow-up: Return for Bilateral L4-5 facet joint block..   Procedures: No procedures performed  No notes on file   Clinical History: MRI LUMBAR SPINE WITHOUT CONTRAST  TECHNIQUE: Multiplanar, multisequence MR imaging of the lumbar spine was performed. No intravenous contrast was administered.  COMPARISON:  None.  FINDINGS: For the purposes of this dictation, the lowest well-formed intervertebral disc space is presumed to be L5-S1.  There is mild lumbar levoscoliosis with apex at L4.  Trace retrolisthesis of L3 on L4 is likely degenerative. Disc desiccation and moderate disc space narrowing are present at L3-4 and L4-5. Disc height and hydration are preserved elsewhere. Vertebral body heights are preserved. No significant vertebral marrow edema is seen. The conus medullaris is normal in signal and terminates at the inferior aspect of L1. Paraspinal soft tissues are unremarkable.  L1-2: Negative.  L2-3: Slight right facet hypertrophy without disc herniation or stenosis.  L3-4: Mild circumferential disc bulging and mild facet hypertrophy result in mild bilateral lateral recess stenosis and borderline spinal stenosis without neural foraminal stenosis.  L4-5: Mild disc bulging, mild ligamentum flavum hypertrophy, and moderate facet hypertrophy result in borderline spinal stenosis without significant lateral recess or neural foraminal stenosis.  L5-S1:  Mild facet hypertrophy without disc herniation or  stenosis.  IMPRESSION: 1. Mild lumbar levoscoliosis. 2. Mild disc degeneration and mild to moderate facet hypertrophy at L3-4 and L4-5 with borderline spinal stenosis. 3. Mild bilateral lateral recess stenosis at L3-4.   Electronically Signed   By: Sebastian Ache M.D.   On: 06/10/2015 10:31  She reports that she has quit smoking. she has never used smokeless tobacco. No results for input(s): HGBA1C, LABURIC in the last 8760 hours.  Objective:  VS:  HT:    WT:   BMI:     BP:138/87  HR: bpm  TEMP: ( )  RESP:  Physical Exam  Constitutional: She is oriented to person, place, and time. She appears well-developed and well-nourished. No distress.  HENT:  Head: Normocephalic and atraumatic.  Nose: Nose normal.  Mouth/Throat: Oropharynx is clear and moist.  Eyes: Conjunctivae are normal. Pupils are equal, round, and reactive to light.  Neck: Normal range of motion. Neck supple.  Cardiovascular: Regular rhythm and intact distal pulses.  Pulmonary/Chest:  Effort normal. No respiratory distress.  Abdominal: She exhibits no distension. There is no guarding.  Musculoskeletal:  Patient arises slowly from a seated position.  She has pain with extension.  She is very guarded in terms of let me with maneuver her spine at all.  She is very painful with rocking over the vertebral bodies at the L4 and L5 region.  She has a lot of pain in the musculoskeletal multi-muscles quadratus lumborum bilaterally.  No pain over the greater trochanters and no pain with hip rotation internal or external.  She has a negative Patrick's test bilaterally.  She has good strength bilaterally in the lower extremities and a negative slump test.  Neurological: She is alert and oriented to person, place, and time. She exhibits normal muscle tone. Coordination normal.  Skin: Skin is warm. No rash noted. No erythema.  Psychiatric: She has a normal mood and affect. Her behavior is normal.  Nursing note and vitals reviewed.   Ortho Exam Imaging: No results found.  Past Medical/Family/Surgical/Social History: Medications & Allergies reviewed per EMR There are no active problems to display for this patient.  Past Medical History:  Diagnosis Date  . Chronic back pain    Family History  Problem Relation Age of Onset  . Skin cancer Father 14  . Cancer Paternal Aunt 63  . Colon cancer Paternal Grandmother 28  . Breast cancer Mother 32       passed at 51   Past Surgical History:  Procedure Laterality Date  . dental implant     Social History   Occupational History  . Not on file  Tobacco Use  . Smoking status: Former Games developer  . Smokeless tobacco: Never Used  Substance and Sexual Activity  . Alcohol use: Yes  . Drug use: No  . Sexual activity: Yes    Partners: Male    Birth control/protection: None

## 2017-04-09 NOTE — Telephone Encounter (Signed)
Submitted auth request on [employer] web site

## 2017-04-10 NOTE — Telephone Encounter (Signed)
Left msg #1

## 2017-04-10 NOTE — Telephone Encounter (Signed)
Ok to schedule. auth received for code [id number]. [id number]. eff 04/14/17-07/13/17. I will enter referral.

## 2017-04-11 ENCOUNTER — Other Ambulatory Visit (INDEPENDENT_AMBULATORY_CARE_PROVIDER_SITE_OTHER): Payer: Self-pay | Admitting: Physical Medicine and Rehabilitation

## 2017-04-11 ENCOUNTER — Telehealth (INDEPENDENT_AMBULATORY_CARE_PROVIDER_SITE_OTHER): Payer: Self-pay | Admitting: Physical Medicine and Rehabilitation

## 2017-04-11 MED ORDER — BACLOFEN 10 MG PO TABS: 10.0000 mg | ORAL_TABLET | Freq: Three times a day (TID) | ORAL | 3 refills | Status: DC | PRN

## 2017-04-11 MED ORDER — DIAZEPAM 5 MG PO TABS: ORAL_TABLET | ORAL | 0 refills | Status: DC

## 2017-04-11 NOTE — Telephone Encounter (Signed)
Scheduled for 1/23 at 1500.

## 2017-04-11 NOTE — Telephone Encounter (Signed)
Faxed to patient's pharmacy.  

## 2017-04-11 NOTE — Telephone Encounter (Signed)
Baclofen re-sent, valium printed

## 2017-04-23 ENCOUNTER — Ambulatory Visit (INDEPENDENT_AMBULATORY_CARE_PROVIDER_SITE_OTHER): Payer: 59 | Admitting: Physical Medicine and Rehabilitation

## 2017-04-23 ENCOUNTER — Encounter (INDEPENDENT_AMBULATORY_CARE_PROVIDER_SITE_OTHER): Payer: Self-pay | Admitting: Physical Medicine and Rehabilitation

## 2017-04-23 ENCOUNTER — Ambulatory Visit (INDEPENDENT_AMBULATORY_CARE_PROVIDER_SITE_OTHER): Payer: 59

## 2017-04-23 VITALS — BP 122/69 | HR 93 | Temp 98.4°F

## 2017-04-23 DIAGNOSIS — M47816 Spondylosis without myelopathy or radiculopathy, lumbar region: Secondary | ICD-10-CM | POA: Diagnosis not present

## 2017-04-23 MED ORDER — METHYLPREDNISOLONE ACETATE 80 MG/ML IJ SUSP
80.0000 mg | Freq: Once | INTRAMUSCULAR | Status: AC
  Administered 2017-04-23: 80 mg

## 2017-04-23 NOTE — Patient Instructions (Signed)

## 2017-04-23 NOTE — Progress Notes (Deleted)
Pt states pain in lower back. Pt states pain has been going on for about 10 years. Pt states walking, sitting, and breathing makes pain worse. Pt states jacuzzi with bath salts help pain. +Driver, -BT, Dye Allergies.

## 2017-04-23 NOTE — Procedures (Signed)
[name] is a 52 year old female comes in today for planned bilateral L4-5 facet joint blocks.  Please see our prior evaluation and management note for further details and justification.  Lumbar Facet Joint Intra-Articular Injection(s) with Fluoroscopic Guidance  [name]      Date of Birth: 10/02/1965 MRN: [id number] PCP: Vena AustriaStaebler, Andreas, MD      Visit Date: 04/23/2017   Universal Protocol:    Date/Time: 04/23/2017  Consent Given By: the patient  Position: PRONE   Additional Comments: Vital signs were monitored before and after the procedure. Patient was prepped and draped in the usual sterile fashion. The correct patient, procedure, and site was verified.   Injection Procedure Details:  Procedure Site One Meds Administered:  Meds ordered this encounter  Medications  . methylPREDNISolone acetate (DEPO-MEDROL) injection 80 mg     Laterality: Bilateral  Location/Site:  L4-L5  Needle size: 22 guage  Needle type: Spinal  Needle Placement: Articular  Findings:  -Comments: Excellent flow of contrast producing a partial arthrogram.  Procedure Details: The fluoroscope beam is vertically oriented in AP, and the inferior recess is visualized beneath the lower pole of the inferior apophyseal process, which represents the target point for needle insertion. When direct visualization is difficult the target point is located at the medial projection of the vertebral pedicle. The region overlying each aforementioned target is locally anesthetized with a 1 to 2 ml. volume of 1% Lidocaine without Epinephrine.   The spinal needle was inserted into each of the above mentioned facet joints using biplanar fluoroscopic guidance. A 0.25 to 0.5 ml. volume of Isovue-250 was injected and a partial facet joint arthrogram was obtained. A single spot film was obtained of the resulting arthrogram.    One to 1.25 ml of the steroid/anesthetic solution was then injected into each of  the facet joints noted above.   Additional Comments:  The patient tolerated the procedure well Dressing: Band-Aid    Post-procedure details: Patient was observed during the procedure. Post-procedure instructions were reviewed.  Patient left the clinic in stable condition.  Pertinent Imaging: MRI LUMBAR SPINE WITHOUT CONTRAST  TECHNIQUE: Multiplanar, multisequence MR imaging of the lumbar spine was performed. No intravenous contrast was administered.  COMPARISON:  None.  FINDINGS: For the purposes of this dictation, the lowest well-formed intervertebral disc space is presumed to be L5-S1.  There is mild lumbar levoscoliosis with apex at L4. Trace retrolisthesis of L3 on L4 is likely degenerative. Disc desiccation and moderate disc space narrowing are present at L3-4 and L4-5. Disc height and hydration are preserved elsewhere. Vertebral body heights are preserved. No significant vertebral marrow edema is seen. The conus medullaris is normal in signal and terminates at the inferior aspect of L1. Paraspinal soft tissues are unremarkable.  L1-2: Negative.  L2-3: Slight right facet hypertrophy without disc herniation or stenosis.  L3-4: Mild circumferential disc bulging and mild facet hypertrophy result in mild bilateral lateral recess stenosis and borderline spinal stenosis without neural foraminal stenosis.  L4-5: Mild disc bulging, mild ligamentum flavum hypertrophy, and moderate facet hypertrophy result in borderline spinal stenosis without significant lateral recess or neural foraminal stenosis.  L5-S1:  Mild facet hypertrophy without disc herniation or stenosis.  IMPRESSION: 1. Mild lumbar levoscoliosis. 2. Mild disc degeneration and mild to moderate facet hypertrophy at L3-4 and L4-5 with borderline spinal stenosis. 3. Mild bilateral lateral recess stenosis at L3-4.   Electronically Signed   By: Sebastian AcheAllen  Grady M.D.   On: 06/10/2015 10:31

## 2017-05-26 ENCOUNTER — Telehealth (INDEPENDENT_AMBULATORY_CARE_PROVIDER_SITE_OTHER): Payer: Self-pay | Admitting: Physical Medicine and Rehabilitation

## 2017-05-26 NOTE — Telephone Encounter (Signed)
Try to get in for L4-5 facets, should do valium ahead of time

## 2017-05-27 NOTE — Telephone Encounter (Signed)
Needs auth for [id number]- bilateral L4-5 Facets. Not yet scheduled. Also needs message sent to Dr. Alvester MorinNewton when scheduled to prescribe Valium. Uses [facility] [address] in [place]. Patient works for [employer], so she will try to expedite auth.

## 2017-06-09 NOTE — Telephone Encounter (Signed)
Patient called asking about the status of the authorization for her injection. She would like a call back at [phone].

## 2017-06-10 NOTE — Telephone Encounter (Signed)
Denial should have gone to patient as well, it basically is not allowing us to complete facet injection this close together and is saying that basically Monia P[place]na thinks if you identify the facet joints as the pain generator we should proceed to RFA - ie ablation of the medial branches.  They will not approve another facet injection as they see them as "diagnostic". If she has had recent within year or two PT or chiropractic we might be able to get authorization for RFA.

## 2017-06-10 NOTE — Telephone Encounter (Signed)
Pt insurance denied repeat injections. Please Advise.

## 2017-06-11 NOTE — Telephone Encounter (Signed)
Spoke with pt and she is all for RFA, asked pt about PT and Chiropractic sessions, pt told me where she received Chiropractic sessions with Dr. Dannial Monarchon Noah in [place]. I called Dr. Anette RiedelNoah and talked to OngLeslie and she stated that it has been since 2016 since they've had her as a pt. Called Mrs. Leticia PennaZiegler back and told her that her insurance will not approve RFA if she does not have 4-6 weeks of PT. Pt states she would like to have an office visit for a possible trigger point injection and that she will regroup with PT.

## 2017-06-13 NOTE — Telephone Encounter (Signed)
Pt is scheduled for 07/09/17

## 2017-06-17 ENCOUNTER — Telehealth (INDEPENDENT_AMBULATORY_CARE_PROVIDER_SITE_OTHER): Payer: Self-pay | Admitting: Physical Medicine and Rehabilitation

## 2017-06-17 NOTE — Telephone Encounter (Signed)
Patient called and left a message asking for the codes for RFA. She wants to check with [employer] to see if physical therapy/ chiropractic is really required for authorization. I called her back and left a message with the CPT code for Lumbar Facet Denervation.

## 2017-07-09 ENCOUNTER — Ambulatory Visit (INDEPENDENT_AMBULATORY_CARE_PROVIDER_SITE_OTHER): Payer: 59 | Admitting: Physical Medicine and Rehabilitation

## 2017-07-09 ENCOUNTER — Encounter (INDEPENDENT_AMBULATORY_CARE_PROVIDER_SITE_OTHER): Payer: Self-pay | Admitting: Physical Medicine and Rehabilitation

## 2017-07-09 ENCOUNTER — Ambulatory Visit (INDEPENDENT_AMBULATORY_CARE_PROVIDER_SITE_OTHER): Payer: Self-pay

## 2017-07-09 VITALS — BP 96/57 | HR 102 | Temp 97.7°F

## 2017-07-09 DIAGNOSIS — M545 Low back pain, unspecified: Secondary | ICD-10-CM

## 2017-07-09 DIAGNOSIS — G8929 Other chronic pain: Secondary | ICD-10-CM

## 2017-07-09 DIAGNOSIS — M5136 Other intervertebral disc degeneration, lumbar region: Secondary | ICD-10-CM | POA: Diagnosis not present

## 2017-07-09 DIAGNOSIS — M47816 Spondylosis without myelopathy or radiculopathy, lumbar region: Secondary | ICD-10-CM

## 2017-07-09 NOTE — Progress Notes (Signed)
 .  Numeric Pain Rating Scale and Functional Assessment Average Pain 7 Pain Right Now 3 My pain is constant, dull and stabbing Pain is worse with: walking, bending and some activites Pain improves with: therapy/exercise   In the last MONTH (on 0-10 scale) has pain interfered with the following?  1. General activity like being  able to carry out your everyday physical activities such as walking, climbing stairs, carrying groceries, or moving a chair?  Rating(4)  2. Relation with others like being able to carry out your usual social activities and roles such as  activities at home, at work and in your community. Rating(2)  3. Enjoyment of life such that you have  been bothered by emotional problems such as feeling anxious, depressed or irritable?  Rating(1)

## 2017-07-17 NOTE — Progress Notes (Signed)
Angela AmmonsSharon Humphrey - 52 y.o. female MRN [id number]  Date of birth: 01/19/1966  Office Visit Note: Visit Date: 07/09/2017 PCP: Angela AustriaStaebler, Andreas, MD Referred by: Angela AustriaStaebler, Andreas, MD  Subjective: Chief Complaint  Patient presents with  . Lower Back - Pain   HPI: [name] is a 52 year old female quite well known to us even though it only seen on a few occasions over the years.  Her history is well-documented but briefly she has a history of chronic axial low back pain that at baseline is an aching pain worse with standing and flexion extension.  She manages this with exercise and mild medication.  She however has intermittent flareups of severe low back pain and almost spasming pain that will get her to a point really where she cannot move and she has gone to urgent care on various occasions.  In the past what is helped the most was bilateral facet joint blocks.  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  She has some degenerative disc changes but no real focal nerve compression or stenosis.  No radicular complaints no specific trauma.  No focal weakness or other red flag symptoms.  Unfortunately with her last flareup we tried to get approval for facet joint block and this was denied by her insurance company.  Interestingly she works for the same [employer].  Their reasoning for denial was that with facet joint blocks they see these as diagnostic procedures with the goal towards radiofrequency ablation for more definitive treatment that might last longer.  While I agree with this 99% of the time there are folks that do get relief intermittently with flareups because of activity levels that have increased in her back pain is pretty significant.   in any event, she comes in today with worsening back pain midline bilateral with some improvement of recent flareup but still more than she would like.  There is no referral pattern into the legs.  She is tried muscle relaxers and she has  had physical therapy in the past just not recently.   Review of Systems  Constitutional: Negative for chills, fever, malaise/fatigue and weight loss.  HENT: Negative for hearing loss and sinus pain.   Eyes: Negative for blurred vision, double vision and photophobia.  Respiratory: Negative for cough and shortness of breath.   Cardiovascular: Negative for chest pain, palpitations and leg swelling.  Gastrointestinal: Negative for abdominal pain, nausea and vomiting.  Genitourinary: Negative for flank pain.  Musculoskeletal: Positive for back pain. Negative for myalgias.  Skin: Negative for itching and rash.  Neurological: Negative for tremors, focal weakness and weakness.  Endo/Heme/Allergies: Negative.   Psychiatric/Behavioral: Negative for depression.  All other systems reviewed and are negative.  Otherwise per HPI.  Assessment & Plan: Visit Diagnoses:  1. Chronic bilateral low back pain without sciatica   2. Spondylosis without myelopathy or radiculopathy, lumbar region   3. Degenerative disc disease, lumbar     Plan: Findings:  Chronic worsening axial low back pain which is a combination of facet joint mediated pain as evidenced by the exam and imaging and prior diagnostic facet joint blocks which are documented.  She also has some level of myofascial pain with taut bands and paraspinal tightness along the quadratus lumborum bilaterally.  She has intermittent extreme flareups which are almost spasming type flareups.  We discussed this at length with her and spent over 30 minutes in direct conversation about her case and the issue with the denials of insurance  and injections and radiofrequency ablation.  I think the next step would be fluoroscopically guided myofascial muscle/tendon injection along the quadratus lumborum bilaterally.  We can do this today.  Depending on the relief she gets with that I would have her follow-up with a good physical therapist to try to look at possibly dry  needling of the same area along with manual myofascial release techniques.  Otherwise she would be a good candidate for radiofrequency ablation of the facet joints.  This would be of the L4-5 facet joints bilaterally.  She meets all the criteria for radiofrequency ablation including time location and exam as well as failure of conservative care and double diagnostic blocks which have been documented.  She has not had recent physical therapy and that something we would have to look at.    Meds & Orders: No orders of the defined types were placed in this encounter.   Orders Placed This Encounter  Procedures  . XR C-ARM NO REPORT    Follow-up: Return if symptoms worsen or fail to improve.   Procedures: Muscle/Tendon Injection - Posterior Approach with Fluoroscopic Guidance  [name]      Date of Birth: 30-Apr-1965 MRN: [id number] PCP: Angela Austria, MD      Visit Date: 07/09/2017   Universal Protocol:    Date/Time: 04/18/195:40 AM  Consent Given By: the patient  Position: PRONE  Additional Comments: Vital signs were monitored before and after the procedure. Patient was prepped and draped in the usual sterile fashion. The correct patient, procedure, and site was verified.   Injection Procedure Details:  Procedure Site One Meds Administered: No orders of the defined types were placed in this encounter.    Laterality: Bilateral  Location/Site: Bilateral Quadratus lumborum  Needle size: 22 G  Needle type: spinal needle  Needle Placement: Just below L3 transverse process on AP and ventral to levela of facet joint  Findings:  -Contrast Used: 0.5 mL iohexol 180 mg iodine/mL   -Comments: Flow of contrast was intramuscular without vasular uptake  Procedure Details: Fluoroscope was adjusted for standard AP view perpendicular to the table.  The soft tissues overlying this target area described above were infiltrated with 4 ml. of 1% Lidocaine without  Epinephrine. A #22 gauge spinal needle was inserted perpendicular to the fluoroscope table and advanced to the target area using fluoroscopic guidance.     Position described above was confirmed with biplanar imaging and muscular injection was confirmed by 2 ml. volume of Omnipaque-240 contrast agent. After negative aspirate for gross pus or blood, the injectate was delivered. Radiographs were obtained for documentation purposes.   Additional Comments:  No complications occurred Dressing: Band-Aid    Post-procedure details: Patient was observed during the procedure. Post-procedure instructions were reviewed.  Patient left the clinic in stable condition.   Clinical History: MRI LUMBAR SPINE WITHOUT CONTRAST  TECHNIQUE: Multiplanar, multisequence MR imaging of the lumbar spine was performed. No intravenous contrast was administered.  COMPARISON:  None.  FINDINGS: For the purposes of this dictation, the lowest well-formed intervertebral disc space is presumed to be L5-S1.  There is mild lumbar levoscoliosis with apex at L4. Trace retrolisthesis of L3 on L4 is likely degenerative. Disc desiccation and moderate disc space narrowing are present at L3-4 and L4-5. Disc height and hydration are preserved elsewhere. Vertebral body heights are preserved. No significant vertebral marrow edema is seen. The conus medullaris is normal in signal and terminates at the inferior aspect of L1. Paraspinal soft tissues are  unremarkable.  L1-2: Negative.  L2-3: Slight right facet hypertrophy without disc herniation or stenosis.  L3-4: Mild circumferential disc bulging and mild facet hypertrophy result in mild bilateral lateral recess stenosis and borderline spinal stenosis without neural foraminal stenosis.  L4-5: Mild disc bulging, mild ligamentum flavum hypertrophy, and moderate facet hypertrophy result in borderline spinal stenosis without significant lateral recess or neural  foraminal stenosis.  L5-S1:  Mild facet hypertrophy without disc herniation or stenosis.  IMPRESSION: 1. Mild lumbar levoscoliosis. 2. Mild disc degeneration and mild to moderate facet hypertrophy at L3-4 and L4-5 with borderline spinal stenosis. 3. Mild bilateral lateral recess stenosis at L3-4.   Electronically Signed   By: Sebastian Ache M.D.   On: 06/10/2015 10:31   She reports that she has quit smoking. She has never used smokeless tobacco. No results for input(s): HGBA1C, LABURIC in the last 8760 hours.  Objective:  VS:  HT:    WT:   BMI:     BP:(!) 96/57  HR:(!) 102bpm  TEMP:97.7 F (36.5 C)(Oral)  RESP:100 % Physical Exam  Constitutional: She is oriented to person, place, and time. She appears well-developed and well-nourished. No distress.  HENT:  Head: Normocephalic and atraumatic.  Nose: Nose normal.  Mouth/Throat: Oropharynx is clear and moist.  Eyes: Pupils are equal, round, and reactive to light. Conjunctivae are normal.  Neck: Normal range of motion. Neck supple.  Cardiovascular: Regular rhythm and intact distal pulses.  Pulmonary/Chest: Effort normal. No respiratory distress.  Abdominal: She exhibits no distension. There is no guarding.  Musculoskeletal:  Examination of the lumbar spine shows concordant low back pain with facet joint loading maneuver with extension rotation bilaterally.  She however also has very tight quadratus lumborum and paraspinal musculature with taut bands.  She has no pain over the greater trochanters or PSIS.  She has no pain with hip rotation.  She has good distal strength.  She does ambulate without aid.  Neurological: She is alert and oriented to person, place, and time. She exhibits normal muscle tone. Coordination normal.  Skin: Skin is warm. No rash noted. No erythema.  Psychiatric: She has a normal mood and affect. Her behavior is normal.  Nursing note and vitals reviewed.   Ortho Exam Imaging: No results found.  Past  Medical/Family/Surgical/Social History: Medications & Allergies reviewed per EMR, new medications updated. There are no active problems to display for this patient.  Past Medical History:  Diagnosis Date  . Chronic back pain    Family History  Problem Relation Age of Onset  . Skin cancer Father 98  . Cancer Paternal Aunt 40  . Colon cancer Paternal Grandmother 62  . Breast cancer Mother 19       passed at 63   Past Surgical History:  Procedure Laterality Date  . dental implant     Social History   Occupational History  . Not on file  Tobacco Use  . Smoking status: Former Games developer  . Smokeless tobacco: Never Used  Substance and Sexual Activity  . Alcohol use: Yes  . Drug use: No  . Sexual activity: Yes    Partners: Male    Birth control/protection: None

## 2017-09-23 DIAGNOSIS — H524 Presbyopia: Secondary | ICD-10-CM | POA: Diagnosis not present

## 2017-10-03 DIAGNOSIS — Z0100 Encounter for examination of eyes and vision without abnormal findings: Secondary | ICD-10-CM | POA: Diagnosis not present

## 2017-12-21 DIAGNOSIS — M25469 Effusion, unspecified knee: Secondary | ICD-10-CM | POA: Diagnosis not present

## 2018-01-28 DIAGNOSIS — G5623 Lesion of ulnar nerve, bilateral upper limbs: Secondary | ICD-10-CM | POA: Diagnosis not present

## 2018-01-28 DIAGNOSIS — M25522 Pain in left elbow: Secondary | ICD-10-CM | POA: Diagnosis not present

## 2018-01-28 DIAGNOSIS — M7711 Lateral epicondylitis, right elbow: Secondary | ICD-10-CM | POA: Diagnosis not present

## 2018-01-28 DIAGNOSIS — M25521 Pain in right elbow: Secondary | ICD-10-CM | POA: Diagnosis not present

## 2018-01-28 DIAGNOSIS — M7712 Lateral epicondylitis, left elbow: Secondary | ICD-10-CM | POA: Diagnosis not present

## 2018-01-28 DIAGNOSIS — G5603 Carpal tunnel syndrome, bilateral upper limbs: Secondary | ICD-10-CM | POA: Diagnosis not present

## 2018-02-13 ENCOUNTER — Other Ambulatory Visit: Payer: Self-pay | Admitting: Obstetrics and Gynecology

## 2018-02-13 DIAGNOSIS — Z1231 Encounter for screening mammogram for malignant neoplasm of breast: Secondary | ICD-10-CM

## 2018-02-18 ENCOUNTER — Ambulatory Visit
Admission: RE | Admit: 2018-02-18 | Discharge: 2018-02-18 | Disposition: A | Discharge status: Home / Self Care | Payer: Managed Care, Other (non HMO) | Source: Ambulatory Visit | Attending: Obstetrics and Gynecology | Admitting: Obstetrics and Gynecology

## 2018-02-18 DIAGNOSIS — Z1231 Encounter for screening mammogram for malignant neoplasm of breast: Secondary | ICD-10-CM | POA: Insufficient documentation

## 2018-03-20 DIAGNOSIS — G8929 Other chronic pain: Secondary | ICD-10-CM | POA: Diagnosis not present

## 2018-03-20 DIAGNOSIS — M25562 Pain in left knee: Secondary | ICD-10-CM | POA: Diagnosis not present

## 2018-03-20 DIAGNOSIS — M25522 Pain in left elbow: Secondary | ICD-10-CM | POA: Diagnosis not present

## 2018-03-20 DIAGNOSIS — M25521 Pain in right elbow: Secondary | ICD-10-CM | POA: Diagnosis not present

## 2018-03-26 DIAGNOSIS — M25562 Pain in left knee: Secondary | ICD-10-CM | POA: Diagnosis not present

## 2018-03-26 DIAGNOSIS — G8929 Other chronic pain: Secondary | ICD-10-CM | POA: Diagnosis not present

## 2018-03-26 DIAGNOSIS — M25462 Effusion, left knee: Secondary | ICD-10-CM | POA: Diagnosis not present

## 2018-04-07 DIAGNOSIS — G8929 Other chronic pain: Secondary | ICD-10-CM | POA: Diagnosis not present

## 2018-04-07 DIAGNOSIS — M62838 Other muscle spasm: Secondary | ICD-10-CM | POA: Diagnosis not present

## 2018-04-07 DIAGNOSIS — M545 Low back pain: Secondary | ICD-10-CM | POA: Diagnosis not present

## 2018-04-25 ENCOUNTER — Other Ambulatory Visit (INDEPENDENT_AMBULATORY_CARE_PROVIDER_SITE_OTHER): Payer: Self-pay | Admitting: Physical Medicine and Rehabilitation

## 2018-04-28 NOTE — Telephone Encounter (Signed)
Please advise 

## 2018-06-03 DIAGNOSIS — G8929 Other chronic pain: Secondary | ICD-10-CM | POA: Diagnosis not present

## 2018-06-03 DIAGNOSIS — M25562 Pain in left knee: Secondary | ICD-10-CM | POA: Diagnosis not present

## 2018-06-03 DIAGNOSIS — M545 Low back pain: Secondary | ICD-10-CM | POA: Diagnosis not present

## 2018-06-03 DIAGNOSIS — M7711 Lateral epicondylitis, right elbow: Secondary | ICD-10-CM | POA: Diagnosis not present

## 2018-12-09 ENCOUNTER — Telehealth: Payer: Self-pay | Admitting: Physical Medicine and Rehabilitation

## 2018-12-09 NOTE — Telephone Encounter (Signed)
Ok but talk to me

## 2018-12-09 NOTE — Telephone Encounter (Signed)
Left message #1

## 2018-12-11 NOTE — Telephone Encounter (Signed)
Scheduled for 9/29 at 1530 with driver.

## 2018-12-29 ENCOUNTER — Encounter: Payer: Self-pay | Admitting: Physical Medicine and Rehabilitation

## 2018-12-29 ENCOUNTER — Ambulatory Visit: Payer: Self-pay

## 2018-12-29 ENCOUNTER — Ambulatory Visit (INDEPENDENT_AMBULATORY_CARE_PROVIDER_SITE_OTHER): Payer: 59 | Admitting: Physical Medicine and Rehabilitation

## 2018-12-29 VITALS — BP 136/85 | HR 102 | Ht 66.0 in | Wt 159.0 lb

## 2018-12-29 DIAGNOSIS — M545 Low back pain: Secondary | ICD-10-CM

## 2018-12-29 DIAGNOSIS — S39012A Strain of muscle, fascia and tendon of lower back, initial encounter: Secondary | ICD-10-CM

## 2018-12-29 DIAGNOSIS — G8929 Other chronic pain: Secondary | ICD-10-CM

## 2018-12-29 DIAGNOSIS — M47816 Spondylosis without myelopathy or radiculopathy, lumbar region: Secondary | ICD-10-CM

## 2018-12-29 DIAGNOSIS — M7918 Myalgia, other site: Secondary | ICD-10-CM | POA: Diagnosis not present

## 2018-12-29 NOTE — Progress Notes (Signed)
 .  Numeric Pain Rating Scale and Functional Assessment Average Pain 7 Pain Right Now 7 My pain is constant, sharp and locking pain Pain is worse with: bending and standing Pain improves with: heat/ice and back brace   In the last MONTH (on 0-10 scale) has pain interfered with the following?  1. General activity like being  able to carry out your everyday physical activities such as walking, climbing stairs, carrying groceries, or moving a chair?  Rating(9)  2. Relation with others like being able to carry out your usual social activities and roles such as  activities at home, at work and in your community. Rating(9)  3. Enjoyment of life such that you have  been bothered by emotional problems such as feeling anxious, depressed or irritable?  Rating(3)

## 2018-12-30 MED ORDER — METHYLPREDNISOLONE ACETATE 80 MG/ML IJ SUSP: 80.0000 mg | Freq: Once | INTRAMUSCULAR | Status: DC

## 2018-12-30 NOTE — Procedures (Signed)
Muscle Tendon Insertion injection  [name]      Date of Birth: 1966-02-26 MRN: [id number] PCP: Malachy Mood, MD      Visit Date: 12/29/2018   Universal Protocol:    Date/Time: 09/30/206:01 AM  Consent Given By: Patient  Additional Comments:  Patient was prepped and draped in the usual sterile fashion. The correct patient, procedure, and site was verified.   Injection Procedure Details:  Procedure Site One Meds Administered:  Meds ordered this encounter  Medications  . methylPREDNISolone acetate (DEPO-MEDROL) injection 80 mg     Needle size: 25 gauge, 3.5 inch spinal  Needle Placement: Intramuscular, along the muscle tendon interface of the quadratus lumborum  Procedure Details: Using standard AP approach with C arm directed perpendicular to the table, location just below the L3 or L4 transverse process to the effective side was located and infiltrated with 2 mL of 1% lidocaine without epinephrine for local anesthesia.  A  needle was introduced and guided using biplanar fluoroscopy to target location just below the transverse process and lateral to the facet joint on the AP view and just ventral to the facet joint on the lateral view.  After injection of small amount of contrast showing muscular spread pattern and confirmation of negative blood and air aspiration, the above solution was injected freely without resistance.   Additional Comments:   Patient tolerated procedure well without complication.  Dressing: Band-aid  Post-procedure details: Patient was observed during the procedure. Post-procedure instructions were reviewed.  Patient left the clinic in stable condition.

## 2018-12-30 NOTE — Progress Notes (Signed)
Angela Humphrey - 53 y.o. female MRN [id number]  Date of birth: 05-28-1965  Office Visit Note: Visit Date: 12/29/2018 PCP: Vena Austria, MD Referred by: Vena Austria, MD  Subjective: Chief Complaint  Patient presents with   Lower Back - Pain   HPI: Angela Humphrey is a 53 y.o. 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  She will get these incredible flareups that I like to call tweaking of her back or lumbar sprain strains to some extent.  She leads a pretty active life with horses and all the work that is required with the horses and riding the horses.  She continues to do this quite a bit as a avid horse person.  Since I have seen her she has been followed to some degree from an orthopedic standpoint by Dr.Poggi at the [facility] clinic which is at [facility] clinic.  She has been placed on Celebrex and Flexeril.  We have given her baclofen.  All of these she does use for her back and knee pain.  In terms of her back she reports recent several month history of significant flareup of back pain really again it is always seemingly worse than the time prior but it does start to calm down after a few days of altered activity heat rest ice and stretching as well as medication management with an spasm medications.  Her spine history is such that she does have facet arthropathy fairly mild although MRI is 53 years old at this point at L4-5.  In the past she had gotten relief with facet joint blocks but it was felt like some of this may have just been deep myofascial trigger point treatment.  She does not tolerate deep tissue massage or anything like that as it seems to really flared things up.  She almost has an allodynia or hyperesthesia to palpation across the lower back.  She reports the pain does not refer into the legs although refers in the upper buttock.  She really points to an area around the L4-5 L5-S1 junction.  She does not really specifically endorse any major trauma or falls that  instituted the most current event.  Her pain really again is more intermittent but she sort of has a constant dull aching pain.  She reports average pain is a 7 out of 10.  She will get a sharp locking sensation at times.  Is worse with bending and standing.  She actually talks about it felt like her back was going to snap in half.  There is definitely this emotional quality to the amount of pain that she has in her description at times.  She does wear a back brace at times when doing activity.  It does really affect her daily activities.  She is working from home predominantly.  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  She does have a stand-up desk that she can go sit and stand and she does this quite frequently.  Prior injection when I saw her in April 2019 was deep quadratus lumborum injection using fluoroscopic guidance do to the location of that muscle.  She got outstanding relief at that time.  Review of Systems  Constitutional: Negative for chills, fever, malaise/fatigue and weight loss.  HENT: Negative for hearing loss and sinus pain.   Eyes: Negative for blurred vision, double vision and photophobia.  Respiratory: Negative for cough and shortness of breath.   Cardiovascular: Negative for chest pain, palpitations and leg swelling.  Gastrointestinal: Negative for abdominal pain,  nausea and vomiting.  Genitourinary: Negative for flank pain.  Musculoskeletal: Positive for back pain and joint pain. Negative for myalgias.  Skin: Negative for itching and rash.  Neurological: Negative for tingling, tremors, focal weakness and weakness.  Endo/Heme/Allergies: Negative.   Psychiatric/Behavioral: Negative for depression.  All other systems reviewed and are negative.  Otherwise per HPI.  Assessment & Plan: Visit Diagnoses:  1. Strain of lumbar region, initial encounter     Plan: Findings:  Chronic recalcitrant constant low back pain with intermittent severe flareups and tweaking of the back at times.   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  She seems like she has had a pretty recent flareup over the last 6 weeks of severe back pain with a locking sensation although it has gotten a little bit better to the point where she can move and walk.  She has been taking anti-inflammatory muscle relaxer without relief.  She has done activity modification and home exercises.  I think at this point this is still a combination of perhaps small annular tear of the disc here and they are giving her significant flareup of more of a discogenic pain combined with some level of facet arthropathy which I think gives her some more of the constant pain with standing as well as myofascial pain with some exaggerated response just to do the emotional quality that happens when it severe.  She clearly has extra levels of sensory findings which is palpation and trying to examine her.  She has had a history of not being able to tolerate any sort of massage type situation.  I do think she has taut bands in the quadratus lumborum today on exam and we will complete injection using fluoroscopic guidance here once again.  We have talked about exercises and activity modification and medication.  She will continue down that course and call us if it just does not seem to help.    Meds & Orders:  Meds ordered this encounter  Medications   methylPREDNISolone acetate (DEPO-MEDROL) injection 80 mg    Orders Placed This Encounter  Procedures   Nerve Block   XR C-ARM NO REPORT    Follow-up: No follow-ups on file.   Procedures: No procedures performed  Muscle Tendon Insertion injection  [name]      Date of Birth: 04/26/1965 MRN: [id number] PCP: Vena Austria, MD      Visit Date: 12/29/2018   Universal Protocol:    Date/Time: 09/30/206:01 AM  Consent Given By: Patient  Additional Comments:  Patient was  prepped and draped in the usual sterile fashion. The correct patient, procedure, and site was verified.   Injection Procedure Details:  Procedure Site One Meds Administered:  Meds ordered this encounter  Medications   methylPREDNISolone acetate (DEPO-MEDROL) injection 80 mg     Needle size: 25 gauge, 3.5 inch spinal  Needle Placement: Intramuscular, along the muscle tendon interface of the quadratus lumborum  Procedure Details: Using standard AP approach with C arm directed perpendicular to the table, location just below the L3 or L4 transverse process to the effective side was located and infiltrated with 2 mL of 1% lidocaine without epinephrine for local anesthesia.  A  needle was introduced and guided using biplanar fluoroscopy to target location just below the transverse process and lateral to the facet joint on the AP view and just ventral to the facet  joint on the lateral view.  After injection of small amount of contrast showing muscular spread pattern and confirmation of negative blood and air aspiration, the above solution was injected freely without resistance.   Additional Comments:   Patient tolerated procedure well without complication.  Dressing: Band-aid  Post-procedure details: Patient was observed during the procedure. Post-procedure instructions were reviewed.  Patient left the clinic in stable condition.    Clinical History: MRI LUMBAR SPINE WITHOUT CONTRAST  TECHNIQUE: Multiplanar, multisequence MR imaging of the lumbar spine was performed. No intravenous contrast was administered.  COMPARISON:  None.  FINDINGS: For the purposes of this dictation, the lowest well-formed intervertebral disc space is presumed to be L5-S1.  There is mild lumbar levoscoliosis with apex at L4. Trace retrolisthesis of L3 on L4 is likely degenerative. Disc desiccation and moderate disc space narrowing are present at L3-4 and L4-5. Disc height and hydration are  preserved elsewhere. Vertebral body heights are preserved. No significant vertebral marrow edema is seen. The conus medullaris is normal in signal and terminates at the inferior aspect of L1. Paraspinal soft tissues are unremarkable.  L1-2: Negative.  L2-3: Slight right facet hypertrophy without disc herniation or stenosis.  L3-4: Mild circumferential disc bulging and mild facet hypertrophy result in mild bilateral lateral recess stenosis and borderline spinal stenosis without neural foraminal stenosis.  L4-5: Mild disc bulging, mild ligamentum flavum hypertrophy, and moderate facet hypertrophy result in borderline spinal stenosis without significant lateral recess or neural foraminal stenosis.  L5-S1:  Mild facet hypertrophy without disc herniation or stenosis.  IMPRESSION: 1. Mild lumbar levoscoliosis. 2. Mild disc degeneration and mild to moderate facet hypertrophy at L3-4 and L4-5 with borderline spinal stenosis. 3. Mild bilateral lateral recess stenosis at L3-4.   Electronically Signed   By: Logan Bores M.D.   On: 06/10/2015 10:31   She reports that she has quit smoking. She has never used smokeless tobacco. No results for input(s): HGBA1C, LABURIC in the last 8760 hours.  Objective:  VS:  HT:5\' 6"  (167.6 cm)    WT:159 lb (72.1 kg)   BMI:25.68     BP:136/85   HR:(!) 102bpm   TEMP: ( )   RESP:  Physical Exam Vitals signs and nursing note reviewed.  Constitutional:      General: She is not in acute distress.    Appearance: Normal appearance. She is well-developed. She is not ill-appearing.  HENT:     Head: Normocephalic and atraumatic.  Eyes:     Conjunctiva/sclera: Conjunctivae normal.     Pupils: Pupils are equal, round, and reactive to light.  Cardiovascular:     Rate and Rhythm: Normal rate.     Pulses: Normal pulses.  Pulmonary:     Effort: Pulmonary effort is normal.  Musculoskeletal:     Right lower leg: No edema.     Left lower leg: No edema.      Comments: Patient ambulates without aid.  She goes from sit to stand with some difficulty with full extension.  She has pain with rotation and facet loading to a degree.  She has pretty clear myofascial taut bands in the quadratus lumborum bilaterally with even deep palpation seems to be struggling with that.  No pain over the PSIS or greater trochanters.  No pain with hip rotation.  Skin:    General: Skin is warm and dry.     Findings: No erythema or rash.  Neurological:     General: No focal deficit present.  Mental Status: She is alert and oriented to person, place, and time.     Sensory: No sensory deficit.     Motor: No abnormal muscle tone.     Coordination: Coordination normal.     Gait: Gait normal.  Psychiatric:        Mood and Affect: Mood normal.        Behavior: Behavior normal.     Ortho Exam Imaging: Xr C-arm No Report  Result Date: 12/29/2018 Please see Notes tab for imaging impression.   Past Medical/Family/Surgical/Social History: Medications & Allergies reviewed per EMR, new medications updated. There are no active problems to display for this patient.  Past Medical History:  Diagnosis Date   Chronic back pain    Family History  Problem Relation Age of Onset   Skin cancer Father [id number]   Cancer Paternal Aunt [phone]   Colon cancer Paternal Grandmother [phone]   Breast cancer Mother [phone]       passed at 2049   Past Surgical History:  Procedure Laterality Date   dental implant     Social History   Occupational History   Not on file  Tobacco Use   Smoking status: Former Smoker   Smokeless tobacco: Never Used  Substance and Sexual Activity   Alcohol use: Yes   Drug use: No   Sexual activity: Yes    Partners: Male    Birth control/protection: None

## 2019-04-08 ENCOUNTER — Encounter: Payer: Self-pay | Admitting: Obstetrics and Gynecology

## 2019-04-08 ENCOUNTER — Other Ambulatory Visit: Payer: Self-pay

## 2019-04-08 ENCOUNTER — Ambulatory Visit (INDEPENDENT_AMBULATORY_CARE_PROVIDER_SITE_OTHER): Payer: 59 | Admitting: Obstetrics and Gynecology

## 2019-04-08 VITALS — BP 122/80 | Ht 66.0 in | Wt 169.0 lb

## 2019-04-08 DIAGNOSIS — Z1329 Encounter for screening for other suspected endocrine disorder: Secondary | ICD-10-CM | POA: Diagnosis not present

## 2019-04-08 DIAGNOSIS — Z13220 Encounter for screening for lipoid disorders: Secondary | ICD-10-CM | POA: Diagnosis not present

## 2019-04-08 DIAGNOSIS — N941 Unspecified dyspareunia: Secondary | ICD-10-CM | POA: Diagnosis not present

## 2019-04-08 DIAGNOSIS — N895 Stricture and atresia of vagina: Secondary | ICD-10-CM

## 2019-04-08 DIAGNOSIS — Z0000 Encounter for general adult medical examination without abnormal findings: Secondary | ICD-10-CM

## 2019-04-08 DIAGNOSIS — Z131 Encounter for screening for diabetes mellitus: Secondary | ICD-10-CM

## 2019-04-08 DIAGNOSIS — Z01419 Encounter for gynecological examination (general) (routine) without abnormal findings: Secondary | ICD-10-CM

## 2019-04-08 DIAGNOSIS — Z1239 Encounter for other screening for malignant neoplasm of breast: Secondary | ICD-10-CM

## 2019-04-08 LAB — POCT URINALYSIS DIPSTICK
Bilirubin, UA: NEGATIVE
Glucose, UA: NEGATIVE
Ketones, UA: NEGATIVE
Leukocytes, UA: NEGATIVE
Nitrite, UA: NEGATIVE
Protein, UA: NEGATIVE
Spec Grav, UA: 1.01 (ref 1.010–1.025)
Urobilinogen, UA: 0.2 E.U./dL
pH, UA: 5 (ref 5.0–8.0)

## 2019-04-08 MED ORDER — ESTRADIOL 0.1 MG/GM VA CREA: TOPICAL_CREAM | VAGINAL | 3 refills | Status: AC

## 2019-04-08 NOTE — Progress Notes (Signed)
Gynecology Annual Exam  PCP: Malachy Mood, MD  Chief Complaint:  Chief Complaint  Patient presents with  . Gynecologic Exam    pt wants CBC & urinalysis. no urine issues     History of Present Illness: Patient is a 54 y.o. No obstetric history on file. presents for annual exam. The patient has no complaints today.   LMP: No LMP recorded. Patient is postmenopausal. No PMB  The patient is sexually active. She has significant dyspareunia.  The patient does perform self breast exams.  There is no notable family history of breast or ovarian cancer in her family.  The patient wears seatbelts: yes.   The patient has regular exercise: not asked.    The patient denies current symptoms of depression.    Review of Systems: Review of Systems  Constitutional: Negative for chills and fever.  HENT: Negative for congestion.   Respiratory: Negative for cough and shortness of breath.   Cardiovascular: Negative for chest pain and palpitations.  Gastrointestinal: Negative for abdominal pain, constipation, diarrhea, heartburn, nausea and vomiting.  Genitourinary: Negative for dysuria, frequency and urgency.  Musculoskeletal: Positive for back pain.  Skin: Negative for itching and rash.  Neurological: Negative for dizziness and headaches.  Endo/Heme/Allergies: Negative for polydipsia.  Psychiatric/Behavioral: Negative for depression.    Past Medical History:  Past Medical History:  Diagnosis Date  . Chronic back pain     Past Surgical History:  Past Surgical History:  Procedure Laterality Date  . dental implant      Gynecologic History:  No LMP recorded. Patient is postmenopausal.  Last Pap: Results were: 10/16/2016 NIL and HR HPV negative  Last mammogram: 02/18/2018 Results were: BI-RAD I  Obstetric History: No obstetric history on file.  Family History:  Family History  Problem Relation Age of Onset  . Skin cancer Father 35  . Cancer Paternal Aunt 12  . Colon cancer  Paternal Grandmother 34  . Breast cancer Mother 64       passed at 51    Social History:  Social History   Socioeconomic History  . Marital status: Married    Spouse name: Not on file  . Number of children: Not on file  . Years of education: Not on file  . Highest education level: Not on file  Occupational History  . Not on file  Tobacco Use  . Smoking status: Former Research scientist (life sciences)  . Smokeless tobacco: Never Used  Substance and Sexual Activity  . Alcohol use: Yes  . Drug use: No  . Sexual activity: Yes    Partners: Male    Birth control/protection: None  Other Topics Concern  . Not on file  Social History Narrative  . Not on file   Social Determinants of Health   Financial Resource Strain:   . Difficulty of Paying Living Expenses: Not on file  Food Insecurity:   . Worried About Charity fundraiser in the Last Year: Not on file  . Ran Out of Food in the Last Year: Not on file  Transportation Needs:   . Lack of Transportation (Medical): Not on file  . Lack of Transportation (Non-Medical): Not on file  Physical Activity:   . Days of Exercise per Week: Not on file  . Minutes of Exercise per Session: Not on file  Stress:   . Feeling of Stress : Not on file  Social Connections:   . Frequency of Communication with Friends and Family: Not on file  . Frequency of  Social Gatherings with Friends and Family: Not on file  . Attends Religious Services: Not on file  . Active Member of Clubs or Organizations: Not on file  . Attends Banker Meetings: Not on file  . Marital Status: Not on file  Intimate Partner Violence:   . Fear of Current or Ex-Partner: Not on file  . Emotionally Abused: Not on file  . Physically Abused: Not on file  . Sexually Abused: Not on file    Allergies:  No Known Allergies  Medications: Prior to Admission medications   Medication Sig Start Date End Date Taking? Authorizing Provider  baclofen (LIORESAL) 10 MG tablet TAKE 1 TABLET (10 MG  TOTAL) BY MOUTH EVERY 8 (EIGHT) HOURS AS NEEDED FOR MUSCLE SPASMS (PAIN). Patient not taking: Reported on 04/08/2019 04/28/18   Tyrell Antonio, MD  celecoxib (CELEBREX) 200 MG capsule TAKE 1 CAPSULE BY MOUTH TWICE A DAY 11/04/18   [provider]  cyclobenzaprine (FLEXERIL) 5 MG tablet Take by mouth.    [provider]  diazepam (VALIUM) 5 MG tablet Take by mouth.    [provider]  estradiol (ESTRACE VAGINAL) 0.1 MG/GM vaginal cream Place 1 Applicatorful vaginally at bedtime for 14 days, THEN 1 Applicatorful every other day for 14 days, THEN 1 Applicatorful 3 (three) times a week for 14 days. 04/08/19 05/20/19  Vena Austria, MD  ibuprofen (ADVIL,MOTRIN) 200 MG tablet Take 800 mg by mouth every 6 (six) hours as needed for moderate pain.    [provider]    Physical Exam Vitals: Blood pressure 122/80, height 5\' 6"  (1.676 m), weight 169 lb (76.7 kg).  General: NAD HEENT: normocephalic, anicteric Thyroid: no enlargement, no palpable nodules Pulmonary: No increased work of breathing, CTAB Cardiovascular: RRR, distal pulses 2+ Breast: Breast symmetrical, no tenderness, no palpable nodules or masses, no skin or nipple retraction present, no nipple discharge.  No axillary or supraclavicular lymphadenopathy. Abdomen: NABS, soft, non-tender, non-distended.  Umbilicus without lesions.  No hepatomegaly, splenomegaly or masses palpable. No evidence of hernia  Genitourinary:  External: Normal external female genitalia.  Normal urethral meatus, normal Bartholin's and Skene's glands.    Vagina: vaginal introitus showin atrophic changes and narrowing accommodating only on finger breadth comfortably   Cervix: Grossly normal in appearance, no bleeding  Uterus: Non-enlarged, mobile, normal contour.  No CMT  Adnexa: ovaries non-enlarged, no adnexal masses  Rectal: deferred  Lymphatic: no evidence of inguinal lymphadenopathy Extremities: no edema, erythema, or  tenderness Neurologic: Grossly intact Psychiatric: mood appropriate, affect full  Female chaperone present for pelvic and breast  portions of the physical exam    Assessment: 54 y.o. routine annual exam  Plan: Problem List Items Addressed This Visit    None    Visit Diagnoses    Annual physical exam    -  Primary   Relevant Orders   POCT Urinalysis Dipstick (Completed)   Breast screening       Relevant Orders   MM 3D SCREEN BREAST BILATERAL   Encounter for gynecological examination without abnormal finding       Screening for diabetes mellitus       Relevant Orders   Executive panel   Thyroid disorder screening       Relevant Orders   Executive panel   Lipid screening       Relevant Orders   Executive panel   Dyspareunia, female       Vaginal stenosis  1) Mammogram - recommend yearly screening mammogram.  Mammogram Was ordered today   2) STI screening  was notoffered and therefore not obtained  3) ASCCP guidelines and rational discussed.  Patient opts for every 3 years screening interval  4) Dyspareunia - Rx estrace vaginal cream taper, follow up in 6 weeks.  If patient has continued symptoms discussed vaginal physical therapy for discussion of vaginal dilators  5) Routine healthcare maintenance including cholesterol, diabetes screening discussed Ordered today  6) Return in about 6 weeks (around 05/20/2019) for medication follow up in person.   Vena Austria, MD, [facility], [facility] 04/08/2019, 5:36 PM

## 2019-04-08 NOTE — Patient Instructions (Signed)
[facility] [address] [place] [address]  [facility] [place]  [address] [place]  [address]  Phone: [phone]  

## 2019-04-09 LAB — CMP14+LP+TP+TSH+CBC/PLT
ALT: 13 IU/L (ref 0–32)
AST: 20 IU/L (ref 0–40)
Albumin/Globulin Ratio: 2.3 — ABNORMAL HIGH (ref 1.2–2.2)
Albumin: 4.5 g/dL (ref 3.8–4.9)
Alkaline Phosphatase: 78 IU/L (ref 39–117)
BUN/Creatinine Ratio: 24 — ABNORMAL HIGH (ref 9–23)
BUN: 21 mg/dL (ref 6–24)
Bilirubin Total: 0.3 mg/dL (ref 0.0–1.2)
CO2: 24 mmol/L (ref 20–29)
Calcium: 9.5 mg/dL (ref 8.7–10.2)
Chloride: 101 mmol/L (ref 96–106)
Cholesterol, Total: 195 mg/dL (ref 100–199)
Creatinine, Ser: 0.86 mg/dL (ref 0.57–1.00)
Free Thyroxine Index: 1.8 (ref 1.2–4.9)
GFR calc Af Amer: 88 mL/min/{1.73_m2} (ref >59)
GFR calc non Af Amer: 76 mL/min/{1.73_m2} (ref >59)
Globulin, Total: 2 g/dL (ref 1.5–4.5)
Glucose: 91 mg/dL (ref 65–99)
HDL: 77 mg/dL (ref >39)
Hematocrit: 40.5 % (ref 34.0–46.6)
Hemoglobin: 13.5 g/dL (ref 11.1–15.9)
LDL Chol Calc (NIH): 103 mg/dL — ABNORMAL HIGH (ref 0–99)
LDL/HDL Ratio: 1.3 ratio (ref 0.0–3.2)
MCH: 29.7 pg (ref 26.6–33.0)
MCHC: 33.3 g/dL (ref 31.5–35.7)
MCV: 89 fL (ref 79–97)
Platelets: 204 10*3/uL (ref 150–450)
Potassium: 4 mmol/L (ref 3.5–5.2)
RBC: 4.54 x10E6/uL (ref 3.77–5.28)
RDW: 12.5 % (ref 11.7–15.4)
Sodium: 139 mmol/L (ref 134–144)
T3 Uptake Ratio: 29 % (ref 24–39)
T4, Total: 6.1 ug/dL (ref 4.5–12.0)
TSH: 1.32 u[IU]/mL (ref 0.450–4.500)
Total Protein: 6.5 g/dL (ref 6.0–8.5)
Triglycerides: 82 mg/dL (ref 0–149)
VLDL Cholesterol Cal: 15 mg/dL (ref 5–40)
WBC: 5.4 10*3/uL (ref 3.4–10.8)

## 2019-06-22 ENCOUNTER — Ambulatory Visit: Payer: 59 | Admitting: Obstetrics and Gynecology

## 2019-07-23 ENCOUNTER — Telehealth: Payer: Self-pay

## 2019-07-23 NOTE — Telephone Encounter (Signed)
Pt notified order for mammogram was placed at her AE on 04/08/19. Patient will call to get apt scheduled.

## 2019-07-23 NOTE — Telephone Encounter (Signed)
Patient states she needs a rx/referral for a mammogram. She is very overdue, it slipped her mind when she had her annual apt. [id number]#[phone]

## 2019-08-04 ENCOUNTER — Ambulatory Visit
Admission: RE | Admit: 2019-08-04 | Discharge: 2019-08-04 | Disposition: A | Discharge status: Home / Self Care | Payer: 59 | Source: Ambulatory Visit | Attending: Obstetrics and Gynecology | Admitting: Obstetrics and Gynecology

## 2019-08-04 DIAGNOSIS — Z1231 Encounter for screening mammogram for malignant neoplasm of breast: Secondary | ICD-10-CM | POA: Insufficient documentation

## 2019-08-04 DIAGNOSIS — Z1239 Encounter for other screening for malignant neoplasm of breast: Secondary | ICD-10-CM

## 2019-11-26 IMAGING — MG DIGITAL SCREENING BILATERAL MAMMOGRAM WITH TOMO AND CAD
8 series · 8 of 24 positions shown · non-contrast
Comparison: Previous exam(s).

CLINICAL DATA: Screening.

EXAM:
DIGITAL SCREENING BILATERAL MAMMOGRAM WITH TOMO AND CAD

[R CC synth-2D]
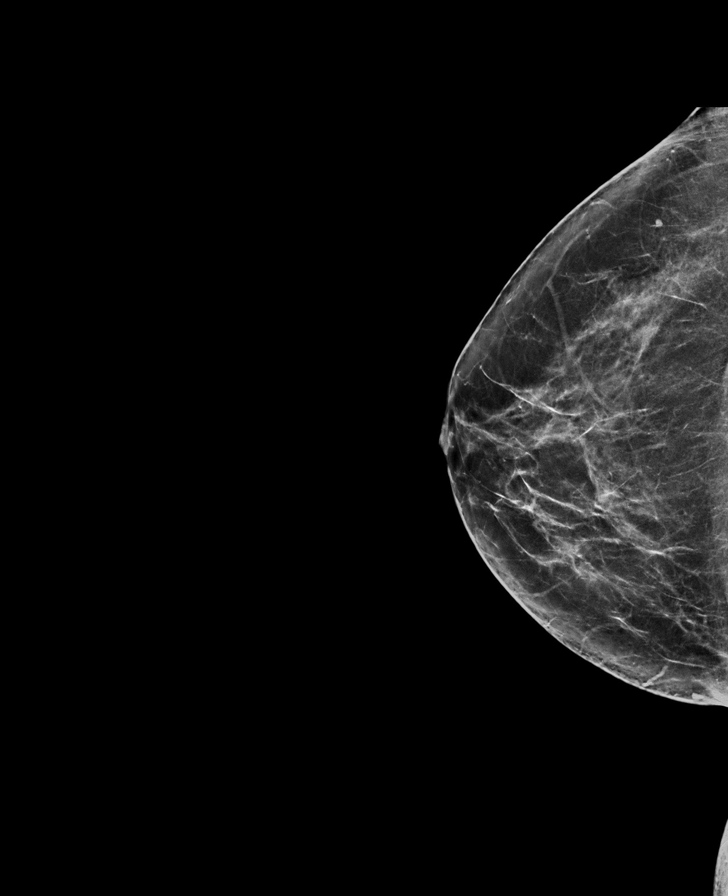

[L MLO synth-2D]
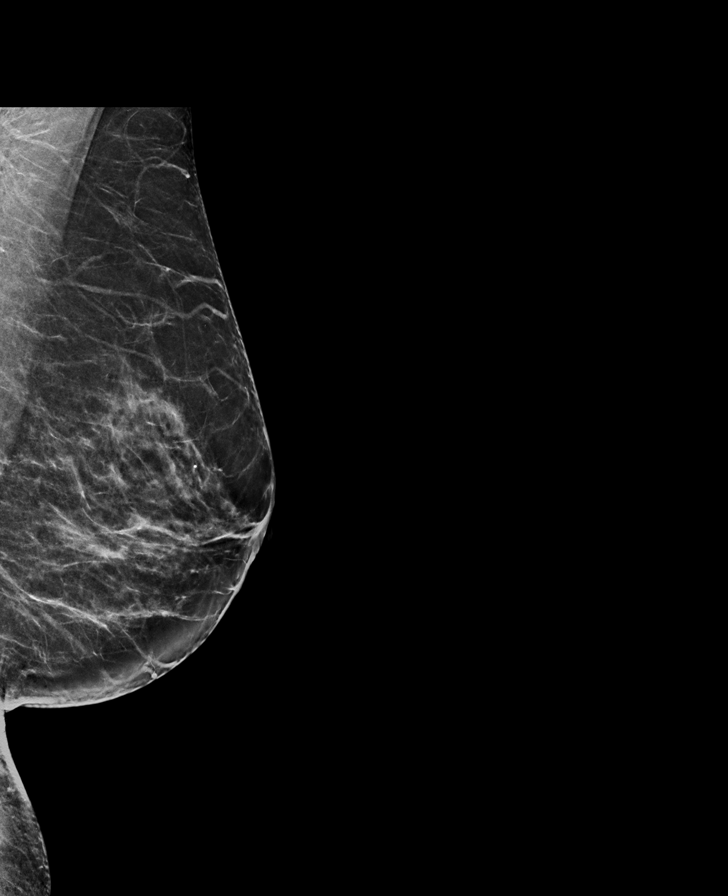

[L CC synth-2D]
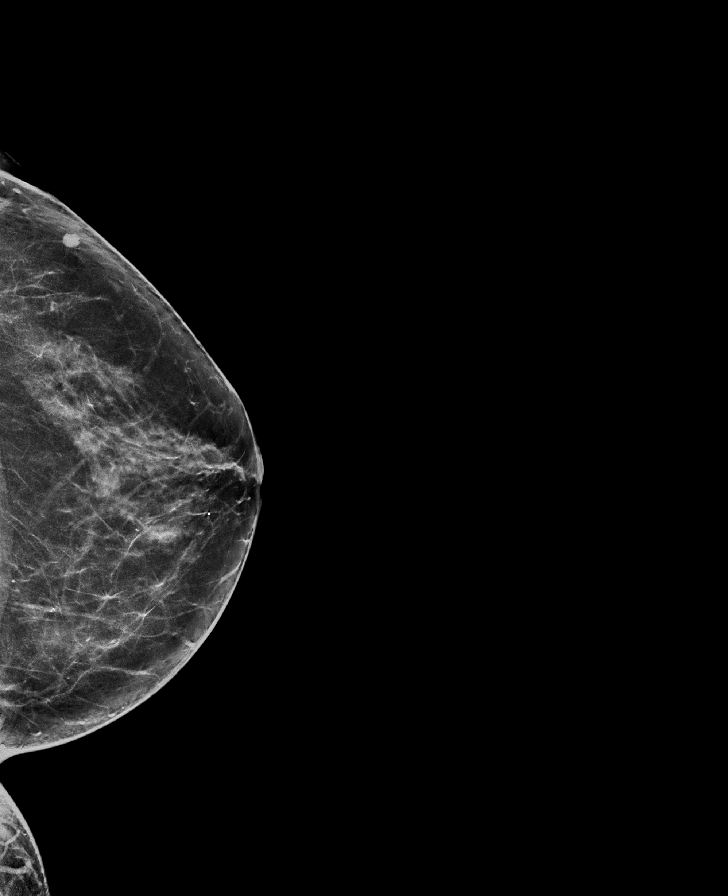

[R MLO synth-2D]
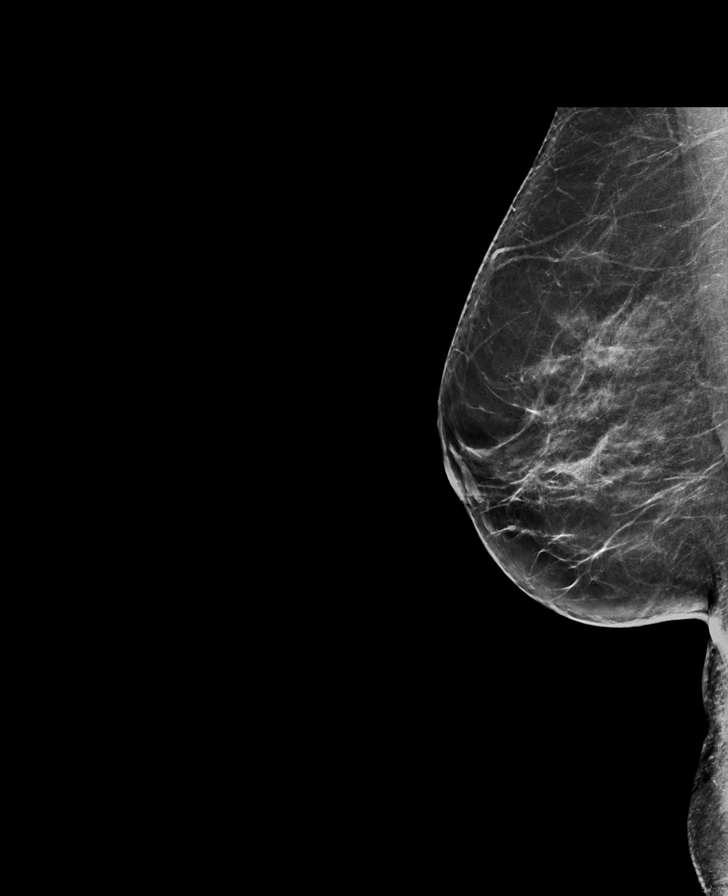

[R MLO tomo · tomo slice 35/68.0]
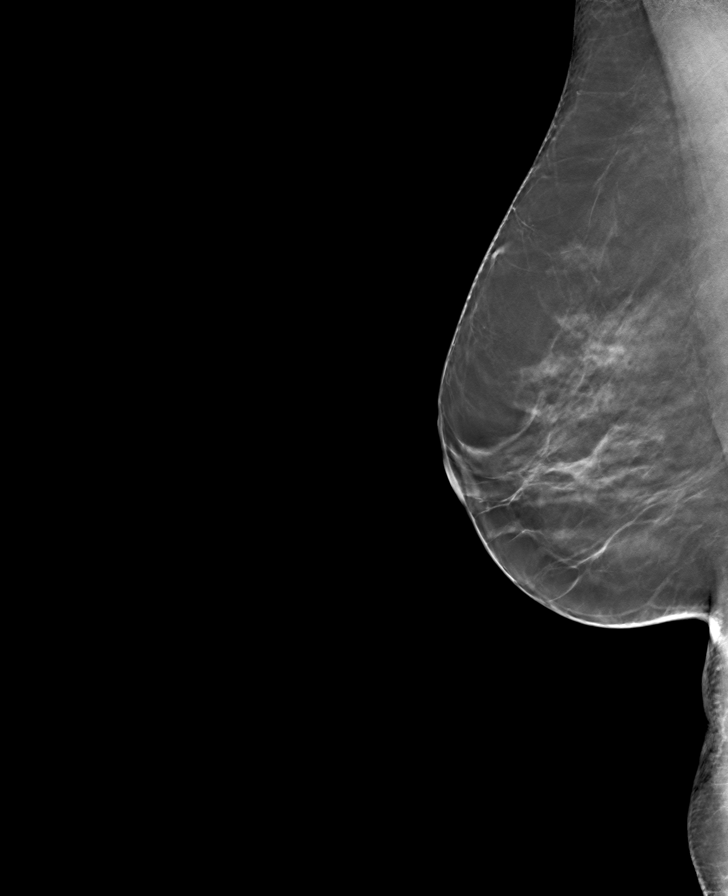

[L MLO tomo · tomo slice 33/66.0]
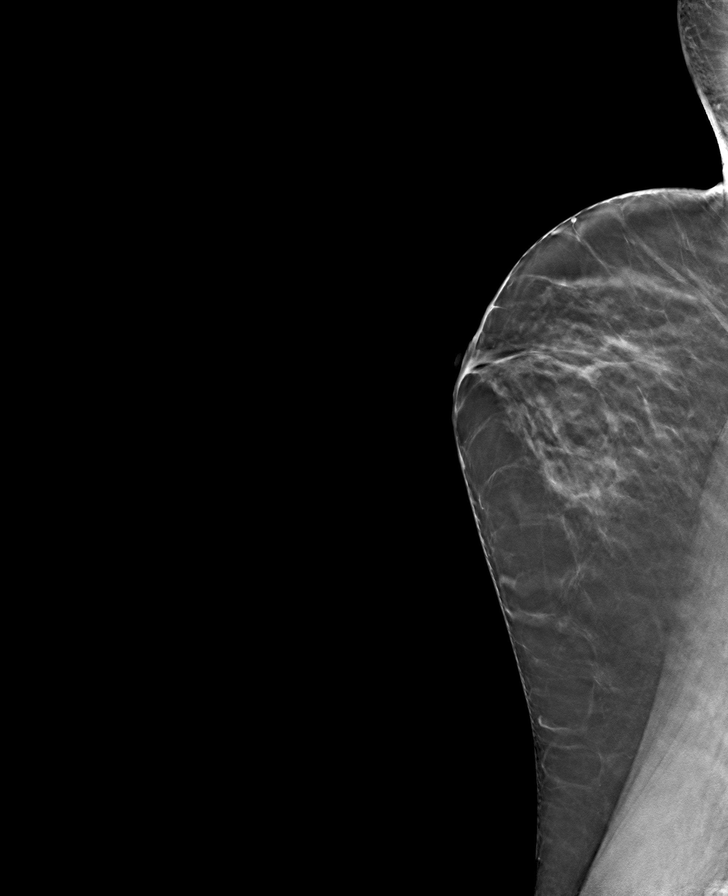

[R CC tomo · tomo slice 33/66.0]
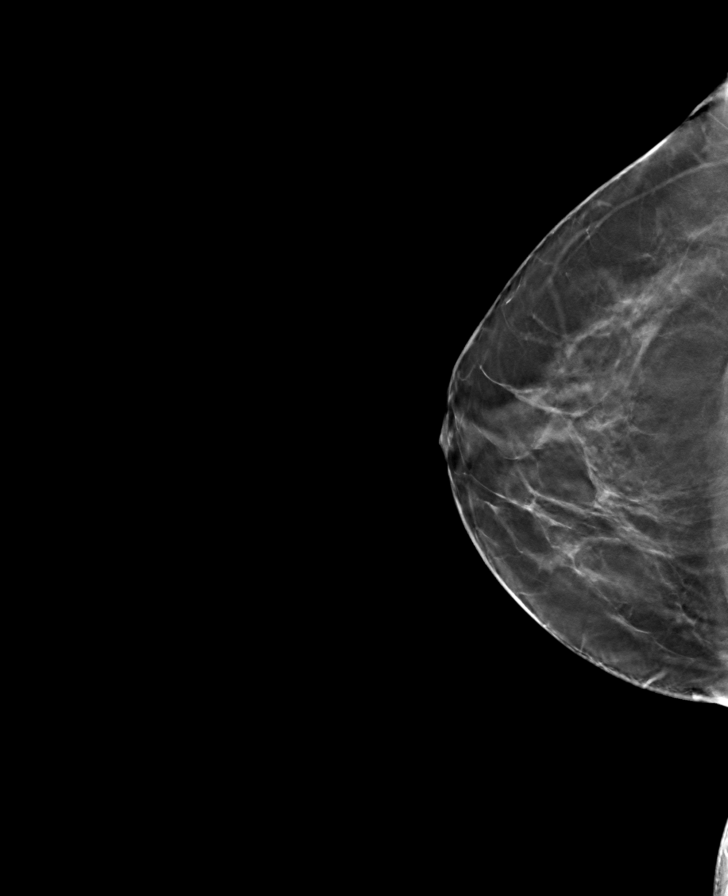

[L CC tomo · tomo slice 36/71.0]
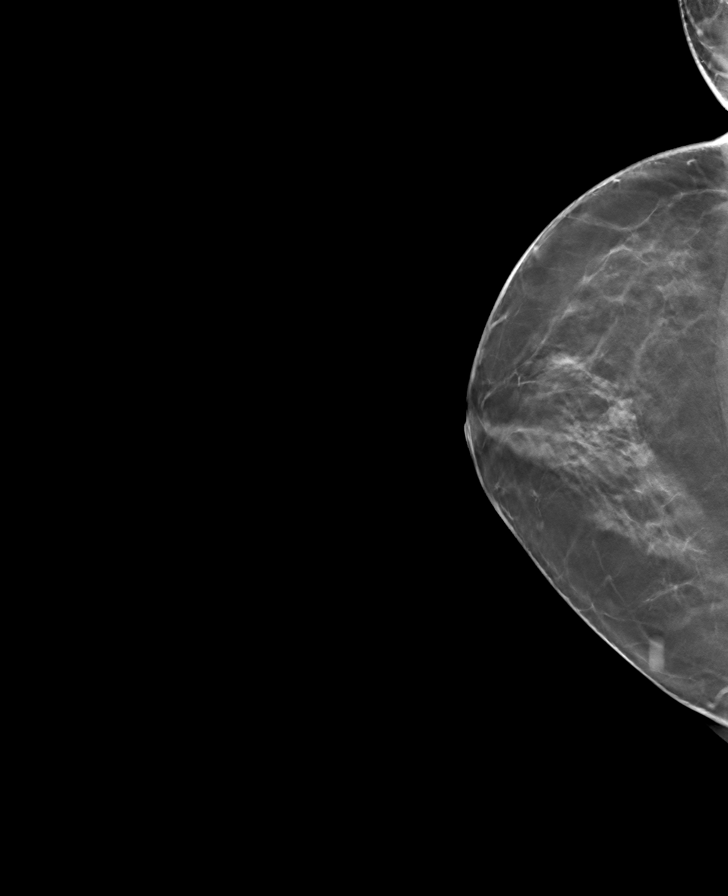

[8 of 24 positions shown; findings below may reference images not displayed]

ACR Breast Density Category b: There are scattered areas of
fibroglandular density.
FINDINGS: There are no findings suspicious for malignancy. Images were
processed with CAD.
IMPRESSION: No mammographic evidence of malignancy. A result letter of this
screening mammogram will be mailed directly to the patient.

RECOMMENDATION:
Screening mammogram in one year. (Code:[id number])

BI-RADS CATEGORY  1: Negative.

## 2020-03-21 DIAGNOSIS — L718 Other rosacea: Secondary | ICD-10-CM | POA: Diagnosis not present

## 2021-01-11 ENCOUNTER — Ambulatory Visit: Payer: 59 | Admitting: Obstetrics and Gynecology

## 2021-01-12 ENCOUNTER — Other Ambulatory Visit: Payer: Self-pay | Admitting: Obstetrics and Gynecology

## 2021-01-12 DIAGNOSIS — Z1239 Encounter for other screening for malignant neoplasm of breast: Secondary | ICD-10-CM

## 2021-01-24 ENCOUNTER — Ambulatory Visit (INDEPENDENT_AMBULATORY_CARE_PROVIDER_SITE_OTHER): Payer: 59 | Admitting: Obstetrics and Gynecology

## 2021-01-24 ENCOUNTER — Other Ambulatory Visit: Payer: Self-pay

## 2021-01-24 ENCOUNTER — Other Ambulatory Visit (HOSPITAL_COMMUNITY)
Admission: RE | Admit: 2021-01-24 | Discharge: 2021-01-24 | Disposition: A | Discharge status: Home / Self Care | Payer: 59 | Source: Ambulatory Visit | Attending: Obstetrics and Gynecology | Admitting: Obstetrics and Gynecology

## 2021-01-24 ENCOUNTER — Encounter: Payer: Self-pay | Admitting: Obstetrics and Gynecology

## 2021-01-24 VITALS — BP 129/83 | HR 101 | Ht 66.0 in | Wt 170.0 lb

## 2021-01-24 DIAGNOSIS — N9419 Other specified dyspareunia: Secondary | ICD-10-CM | POA: Diagnosis not present

## 2021-01-24 DIAGNOSIS — Z124 Encounter for screening for malignant neoplasm of cervix: Secondary | ICD-10-CM

## 2021-01-24 DIAGNOSIS — Z1239 Encounter for other screening for malignant neoplasm of breast: Secondary | ICD-10-CM | POA: Diagnosis not present

## 2021-01-24 DIAGNOSIS — Z0000 Encounter for general adult medical examination without abnormal findings: Secondary | ICD-10-CM | POA: Diagnosis not present

## 2021-01-24 DIAGNOSIS — N952 Postmenopausal atrophic vaginitis: Secondary | ICD-10-CM | POA: Diagnosis not present

## 2021-01-24 DIAGNOSIS — Z01419 Encounter for gynecological examination (general) (routine) without abnormal findings: Secondary | ICD-10-CM

## 2021-01-24 DIAGNOSIS — Z13220 Encounter for screening for lipoid disorders: Secondary | ICD-10-CM

## 2021-01-24 DIAGNOSIS — Z131 Encounter for screening for diabetes mellitus: Secondary | ICD-10-CM

## 2021-01-24 DIAGNOSIS — Z1329 Encounter for screening for other suspected endocrine disorder: Secondary | ICD-10-CM

## 2021-01-24 MED ORDER — ESTRADIOL 0.1 MG/GM VA CREA: 1.0000 g | TOPICAL_CREAM | VAGINAL | 3 refills | Status: DC

## 2021-01-24 NOTE — Progress Notes (Signed)
Gynecology Annual Exam  PCP: Vena Austria, MD  Chief Complaint:  Chief Complaint  Patient presents with   Gynecologic Exam    Annual - requesting labs, no concerns. RM 3    History of Present Illness:Patient is a 55 y.o. G1P0 presents for annual exam. The patient has no complaints today.   LMP: No LMP recorded. Patient is postmenopausal. No PMB  The patient is sexually active. She denies dyspareunia.  The patient does perform self breast exams.  There is no notable family history of breast or ovarian cancer in her family.  The patient wears seatbelts: yes.   The patient has regular exercise: not asked.    The patient denies current symptoms of depression.     Review of Systems: Review of Systems  Constitutional:  Negative for chills and fever.  HENT:  Negative for congestion.   Respiratory:  Negative for cough and shortness of breath.   Cardiovascular:  Negative for chest pain and palpitations.  Gastrointestinal:  Negative for abdominal pain, constipation, diarrhea, heartburn, nausea and vomiting.  Genitourinary:  Negative for dysuria, frequency and urgency.  Musculoskeletal:  Positive for back pain and joint pain.  Skin:  Negative for itching and rash.  Neurological:  Negative for dizziness and headaches.  Endo/Heme/Allergies:  Negative for polydipsia.  Psychiatric/Behavioral:  Negative for depression.    Past Medical History:  There are no problems to display for this patient.   Past Surgical History:  Past Surgical History:  Procedure Laterality Date   dental implant      Gynecologic History:  No LMP recorded. Patient is postmenopausal. Last Pap: Results were: 7/18/2018NIL and HR HPV negative  Last mammogram: 08/04/19 Results were: BI-RAD I  Obstetric History: G1P0  Family History:  Family History  Problem Relation Age of Onset   Skin cancer Father 13   Cancer Paternal Aunt 60   Colon cancer Paternal Grandmother 23   Breast cancer Mother 68        passed at 72    Social History:  Social History   Socioeconomic History   Marital status: Married    Spouse name: Not on file   Number of children: Not on file   Years of education: Not on file   Highest education level: Not on file  Occupational History   Not on file  Tobacco Use   Smoking status: Former   Smokeless tobacco: Never  Vaping Use   Vaping Use: Never used  Substance and Sexual Activity   Alcohol use: Yes   Drug use: No   Sexual activity: Yes    Partners: Male    Birth control/protection: None  Other Topics Concern   Not on file  Social History Narrative   Not on file   Social Determinants of Health   Financial Resource Strain: Not on file  Food Insecurity: Not on file  Transportation Needs: Not on file  Physical Activity: Not on file  Stress: Not on file  Social Connections: Not on file  Intimate Partner Violence: Not on file    Allergies:  No Known Allergies  Medications: Prior to Admission medications   Medication Sig Start Date End Date Taking? Authorizing Provider  celecoxib (CELEBREX) 200 MG capsule TAKE 1 CAPSULE BY MOUTH TWICE A DAY 11/04/18  Yes [provider]  baclofen (LIORESAL) 10 MG tablet TAKE 1 TABLET (10 MG TOTAL) BY MOUTH EVERY 8 (EIGHT) HOURS AS NEEDED FOR MUSCLE SPASMS (PAIN). Patient not taking: Reported on 04/08/2019 04/28/18  Tyrell Antonio, MD  cyclobenzaprine (FLEXERIL) 5 MG tablet Take by mouth.    [provider]  diazepam (VALIUM) 5 MG tablet Take by mouth.    [provider]  ibuprofen (ADVIL,MOTRIN) 200 MG tablet Take 800 mg by mouth every 6 (six) hours as needed for moderate pain.    [provider]    Physical Exam Vitals: Blood pressure 129/83, pulse (!) 101, height 5\' 6"  (1.676 m), weight 170 lb (77.1 kg).  General: NAD HEENT: normocephalic, anicteric Thyroid: no enlargement, no palpable nodules Pulmonary: No increased work of breathing, CTAB Cardiovascular: RRR, distal  pulses 2+ Breast: Breast symmetrical, no tenderness, no palpable nodules or masses, no skin or nipple retraction present, no nipple discharge.  No axillary or supraclavicular lymphadenopathy. Abdomen: NABS, soft, non-tender, non-distended.  Umbilicus without lesions.  No hepatomegaly, splenomegaly or masses palpable. No evidence of hernia  Genitourinary:  External: Normal external female genitalia.  Normal urethral meatus, normal Bartholin's and Skene's glands.    Vagina: Normal vaginal mucosa, no evidence of prolapse.    Cervix: Grossly normal in appearance, no bleeding  Uterus: Non-enlarged, mobile, normal contour.  No CMT  Adnexa: ovaries non-enlarged, no adnexal masses  Rectal: deferred  Lymphatic: no evidence of inguinal lymphadenopathy Extremities: no edema, erythema, or tenderness Neurologic: Grossly intact Psychiatric: mood appropriate, affect full  Female chaperone present for pelvic and breast  portions of the physical exam     Assessment: 55 y.o. G1P0 routine annual exam  Plan: Problem List Items Addressed This Visit   None Visit Diagnoses     Encounter for gynecological examination without abnormal finding    -  Primary   Screening for malignant neoplasm of cervix       Relevant Orders   Cytology - PAP (Completed)   Breast screening       Screening for diabetes mellitus       Relevant Orders   Hemoglobin A1c   Thyroid disorder screening       Lipid screening       Relevant Orders   Lipid panel   Blood tests for routine general physical examination       Relevant Orders   Lipid panel   Hemoglobin A1c   CBC   Comprehensive metabolic panel   Vaginal atrophy       Dyspareunia due to medical condition in female           1) Mammogram - recommend yearly screening mammogram.  Mammogram pending 02/13/2021  2) STI screening  was notoffered and therefore not obtained  3) ASCCP guidelines and rational discussed.  Patient opts for every 3 years screening  interval  4) Osteoporosis  - per USPTF routine screening DEXA at age 19  5) Routine healthcare maintenance including cholesterol, diabetes screening discussed managed by PCP  6)  Return in about 1 year (around 01/24/2022) for Labs sometime in the next week, annual in 1 year.    01/26/2022, MD Vena Austria, [facility] 01/24/2021, 3:57 PM

## 2021-01-26 LAB — CYTOLOGY - PAP
Comment: NEGATIVE
Diagnosis: NEGATIVE
High risk HPV: NEGATIVE

## 2021-02-13 ENCOUNTER — Ambulatory Visit
Admission: RE | Admit: 2021-02-13 | Discharge: 2021-02-13 | Disposition: A | Discharge status: Home / Self Care | Payer: 59 | Source: Ambulatory Visit | Attending: Obstetrics and Gynecology | Admitting: Obstetrics and Gynecology

## 2021-02-13 ENCOUNTER — Other Ambulatory Visit: Payer: Self-pay

## 2021-02-13 DIAGNOSIS — Z1231 Encounter for screening mammogram for malignant neoplasm of breast: Secondary | ICD-10-CM | POA: Diagnosis not present

## 2021-02-13 DIAGNOSIS — Z1239 Encounter for other screening for malignant neoplasm of breast: Secondary | ICD-10-CM

## 2021-02-15 ENCOUNTER — Encounter: Payer: Self-pay | Admitting: Obstetrics and Gynecology

## 2021-07-05 DIAGNOSIS — L718 Other rosacea: Secondary | ICD-10-CM | POA: Diagnosis not present

## 2021-07-05 DIAGNOSIS — L720 Epidermal cyst: Secondary | ICD-10-CM | POA: Diagnosis not present

## 2021-07-05 DIAGNOSIS — D229 Melanocytic nevi, unspecified: Secondary | ICD-10-CM | POA: Diagnosis not present

## 2021-08-21 DIAGNOSIS — H43811 Vitreous degeneration, right eye: Secondary | ICD-10-CM | POA: Diagnosis not present

## 2021-10-03 DIAGNOSIS — H43811 Vitreous degeneration, right eye: Secondary | ICD-10-CM | POA: Diagnosis not present

## 2021-10-30 DIAGNOSIS — H5213 Myopia, bilateral: Secondary | ICD-10-CM | POA: Diagnosis not present

## 2021-10-30 DIAGNOSIS — Z0100 Encounter for examination of eyes and vision without abnormal findings: Secondary | ICD-10-CM | POA: Diagnosis not present

## 2022-04-03 ENCOUNTER — Ambulatory Visit (INDEPENDENT_AMBULATORY_CARE_PROVIDER_SITE_OTHER): Payer: 59 | Admitting: Licensed Practical Nurse

## 2022-04-03 ENCOUNTER — Other Ambulatory Visit: Payer: Self-pay

## 2022-04-03 VITALS — BP 140/80 | HR 107 | Ht 67.0 in | Wt 174.7 lb

## 2022-04-03 DIAGNOSIS — N644 Mastodynia: Secondary | ICD-10-CM

## 2022-04-03 NOTE — Progress Notes (Signed)
SUBJECTIVE: Here with partner for evaluation  for breast pain Timiko has had pain in her Left breast for 2 days.  It is on the outer side, feels tender and her breast feels "heavy". Denies any changes to the color or texture of her skin, changes to her nipple or discharge, has not felt any lumps. She just notes that she had a "sensation: in the breast.  Her mother had inflammatory breast cancer and once of her symptoms was breast heaviness. Yarianna denies any recent injuries. She has been dealing with a cough since October. 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  She did "roll her horse" 3-4 wks ago. She is not aware of any recent bug bites. She was wearing a turtle neck and sweater when she was last in the barn. She has been wearing the same bra as she always does. She does wear under wires, but will get of rid any that have the wire sticking out.  She did take Ibuprofen for the pain, which improved the sensation of feeling heavy.   OBJECTIVE: BP (!) 140/80   Pulse (!) 107   Ht _0  (1.702 m)   Wt 174 lb 11.2 oz (79.2 kg)   BMI 27.36 kg/m  GEN: NAD Breasts: Right breast: no tenderness, masses  or redness, normal skin appearance, nipple intact Left breast: no masses or redness normal skin appearance, nipple intact. Pt reported tenderness when breast palpated at about 3 O'clock.   Pt reports negative BRCA testing   SUBJECTIVE: Left Breast Pain  PLAN: Diagnostic mammogram ordered, front desk staff working on arranging appointment.  Ok to continue Motrin Will fu at annual exam   Roberto Scales, [place], [facility]  04/03/22  5:01 PM

## 2022-04-10 ENCOUNTER — Ambulatory Visit
Admission: RE | Admit: 2022-04-10 | Discharge: 2022-04-10 | Disposition: A | Discharge status: Home / Self Care | Payer: 59 | Source: Ambulatory Visit | Attending: Licensed Practical Nurse | Admitting: Licensed Practical Nurse

## 2022-04-10 DIAGNOSIS — N644 Mastodynia: Secondary | ICD-10-CM

## 2022-04-18 ENCOUNTER — Ambulatory Visit: Payer: 59 | Admitting: Licensed Practical Nurse

## 2022-05-08 ENCOUNTER — Ambulatory Visit (INDEPENDENT_AMBULATORY_CARE_PROVIDER_SITE_OTHER): Payer: 59 | Admitting: Licensed Practical Nurse

## 2022-05-08 ENCOUNTER — Other Ambulatory Visit (HOSPITAL_COMMUNITY)
Admission: RE | Admit: 2022-05-08 | Discharge: 2022-05-08 | Disposition: A | Discharge status: Home / Self Care | Payer: 59 | Source: Ambulatory Visit | Attending: Licensed Practical Nurse | Admitting: Licensed Practical Nurse

## 2022-05-08 VITALS — BP 111/73 | HR 99 | Ht 66.0 in | Wt 172.0 lb

## 2022-05-08 DIAGNOSIS — Z124 Encounter for screening for malignant neoplasm of cervix: Secondary | ICD-10-CM | POA: Diagnosis present

## 2022-05-08 DIAGNOSIS — Z01419 Encounter for gynecological examination (general) (routine) without abnormal findings: Secondary | ICD-10-CM

## 2022-05-08 DIAGNOSIS — N952 Postmenopausal atrophic vaginitis: Secondary | ICD-10-CM

## 2022-05-08 DIAGNOSIS — Z1211 Encounter for screening for malignant neoplasm of colon: Secondary | ICD-10-CM

## 2022-05-08 DIAGNOSIS — N941 Unspecified dyspareunia: Secondary | ICD-10-CM

## 2022-05-08 MED ORDER — ESTRADIOL 0.1 MG/GM VA CREA: 1.0000 g | TOPICAL_CREAM | VAGINAL | 3 refills | Status: DC

## 2022-05-08 NOTE — Progress Notes (Signed)
Gynecology Annual Exam  PCP: Allen Derry, CNM  Chief Complaint:  Chief Complaint  Patient presents with   Gynecologic Exam    History of Present Illness:Patient is a 57 y.o. G1P0 presents for annual exam. The patient reports continued pain with IC.   LMP: No LMP recorded. Patient is postmenopausal.  Postcoital Bleeding: no   The patient is sexually active with 1 female partner but they do not have IC very often d/t to discomfort. . She has dyspareunia-this has been an ongoing problem, she has been using expanders but not as directed, she winces whenever her partner enters.  The patient does perform self breast exams.  There is notable family history of breast or ovarian cancer in her family.  The patient wears seatbelts: yes.   The patient has regular exercise: yes.  Rides horses and walks on the  treadmill  The patient denies current symptoms of depression.    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   HENT: Negative.    Eyes: Negative.   Respiratory: Negative.    Cardiovascular: Negative.   Genitourinary: Negative.   Musculoskeletal:  Positive for back pain.  Skin: Negative.   Neurological:  Positive for tingling.  Endo/Heme/Allergies: Negative.        Hot flashes, feels this is related to her rosacea   Breast tenderness-had recent imaging   Past Medical History:  There are no problems to display for this patient.   Past Surgical History:  Past Surgical History:  Procedure Laterality Date   dental implant      Gynecologic History:  No LMP recorded. Patient is postmenopausal. Last Pap: Results were: 2022 no abnormalities  Last mammogram: 04/2022 Results were: BI-RAD I  Obstetric History: G1P0  Family History:  Family History  Problem Relation  Age of Onset   Skin cancer Father 51   Cancer Paternal Aunt 33   Colon cancer Paternal Grandmother 31   Breast cancer Mother 60       passed at 76    Social History:  Social History   Socioeconomic History   Marital status: Married    Spouse name: Not on file   Number of children: Not on file   Years of education: Not on file   Highest education level: Not on file  Occupational History   Not on file  Tobacco Use   Smoking status: Former   Smokeless tobacco: Never  Vaping Use   Vaping Use: Never used  Substance and Sexual Activity   Alcohol use: Yes   Drug use: No   Sexual activity: Yes    Partners: Male    Birth control/protection: None  Other Topics Concern   Not on file  Social History Narrative   Not on file   Social Determinants of Health   Financial Resource Strain: Not on file  Food Insecurity: Not on file  Transportation Needs: Not on file  Physical Activity: Not on file  Stress: Not on file  Social Connections: Not on file  Intimate Partner Violence: Not on file    Allergies:  No Known Allergies  Medications: Prior to Admission medications   Medication Sig Start Date End Date Taking? Authorizing Provider  baclofen (LIORESAL) 10 MG tablet TAKE 1 TABLET (10 MG TOTAL)  BY MOUTH EVERY 8 (EIGHT) HOURS AS NEEDED FOR MUSCLE SPASMS (PAIN). 04/28/18  Yes Magnus Sinning, MD  benzonatate (TESSALON) 200 MG capsule Take 200 mg by mouth 3 (three) times daily as needed for cough.   Yes [provider]  ibuprofen (ADVIL,MOTRIN) 200 MG tablet Take 800 mg by mouth every 6 (six) hours as needed for moderate pain.   Yes [provider]  predniSONE (DELTASONE) 20 MG tablet Take 20 mg by mouth daily with breakfast.   Yes [provider]  celecoxib (CELEBREX) 200 MG capsule TAKE 1 CAPSULE BY MOUTH TWICE A DAY Patient not taking: Reported on 04/03/2022 11/04/18   [provider]  cyclobenzaprine (FLEXERIL) 5 MG tablet Take by mouth. Patient  not taking: Reported on 04/03/2022    [provider]  diazepam (VALIUM) 5 MG tablet Take by mouth. Patient not taking: Reported on 04/03/2022    [provider]  estradiol (ESTRACE VAGINAL) 0.1 MG/GM vaginal cream Place 1 g vaginally 3 (three) times a week. 05/08/22   DominicNunzio Cobbs, CNM    Physical Exam Vitals: Blood pressure 111/73, pulse 99, height 5' 6"$  (1.676 m), weight 172 lb (78 kg).  General: NAD HEENT: normocephalic, anicteric Thyroid: no enlargement, no palpable nodules Pulmonary: No increased work of breathing, CTAB Cardiovascular: RRR, distal pulses 2+ Breast: Breast symmetrical, no tenderness, no palpable nodules or masses, no skin or nipple retraction present, no nipple discharge.  No axillary or supraclavicular lymphadenopathy. Abdomen: NABS, soft, non-tender, non-distended.  Umbilicus without lesions.  No hepatomegaly, splenomegaly or masses palpable. No evidence of hernia  Genitourinary:  External: Normal external female genitalia.  Normal urethral meatus, normal Bartholin's and Skene's glands.    Vagina: Normal vaginal mucosa, no evidence of prolapse.  Easily able to introduce speculum and open it within the vaginal cannel, also able to easily introduce both fingers for bimanual exam without pt reporting discomfort. Good tone   Cervix: Grossly normal in appearance, no bleeding  Uterus: Non-enlarged, mobile, normal contour.  No CMT  Adnexa: ovaries non-enlarged, no adnexal masses  Rectal: deferred  Lymphatic: no evidence of inguinal lymphadenopathy Extremities: no edema, erythema, or tenderness Neurologic: Grossly intact Psychiatric: mood appropriate, affect full     Assessment: 57 y.o. G1P0 routine annual exam  Plan: Problem List Items Addressed This Visit   None Visit Diagnoses     Well woman exam    -  Primary   Relevant Medications   estradiol (ESTRACE VAGINAL) 0.1 MG/GM vaginal cream   Other Relevant Orders   Cytology - PAP   Cervical  cancer screening       Relevant Orders   Cytology - PAP   Vaginal atrophy       Relevant Medications   estradiol (ESTRACE VAGINAL) 0.1 MG/GM vaginal cream       1) Mammogram - recommend yearly screening mammogram.  Mammogram Is up to date  2) STI screening  wasoffered and declined  3) ASCCP guidelines and rational discussed.  Patient opts for every 3 years screening interval  4) Osteoporosis  - per USPTF routine screening DEXA at age 78   Consider FDA-approved medical therapies in postmenopausal women and men aged 74 years and older, based on the following: a) A hip or vertebral (clinical or morphometric) fracture b) T-score ? -2.5 at the femoral neck or spine after appropriate evaluation to exclude secondary causes C) Low bone mass (T-score between -1.0 and -2.5 at the femoral neck or spine) and a 10-year probability of  a hip fracture ? 3% or a 10-year probability of a major osteoporosis-related fracture ? 20% based on the [place]-adapted WHO algorithm   5) Routine healthcare maintenance including cholesterol, diabetes screening discussed  has wellness labs done through work   6) Colonoscopy Cologard ordered .  Screening recommended starting at age 18 for average risk individuals, age 16 for individuals deemed at increased risk (including African Americans) and recommended to continue until age 93.  For patient age 46-85 individualized approach is recommended.  Gold standard screening is via colonoscopy, Cologuard screening is an acceptable alternative for patient unwilling or unable to undergo colonoscopy.  "Colorectal cancer screening for average?risk adults: 2018 guideline update from the American Cancer Society"CA: A Cancer Journal for Clinicians: Aug 28, 2016   7) Pain with intercourse: rec using expanders as directed. May consider sex therapist or pelvic PT. Continue with estradiol cream.    Roberto Scales, [facility] 05/08/2022, 5:30  PM

## 2022-05-16 LAB — CYTOLOGY - PAP
Chlamydia: NEGATIVE
Comment: NEGATIVE
Comment: NORMAL
Diagnosis: NEGATIVE
Neisseria Gonorrhea: NEGATIVE

## 2022-05-21 DIAGNOSIS — N941 Unspecified dyspareunia: Secondary | ICD-10-CM | POA: Insufficient documentation

## 2023-07-09 ENCOUNTER — Telehealth: Payer: Self-pay | Admitting: Licensed Practical Nurse

## 2023-07-09 NOTE — Telephone Encounter (Signed)
 Patient is scheduled for her annual on 08/01/23 would like to have her mammo done before the annual appt. Is there a way you could put the order in for her to have it done.

## 2023-07-11 ENCOUNTER — Other Ambulatory Visit: Payer: Self-pay | Admitting: Licensed Practical Nurse

## 2023-07-11 DIAGNOSIS — Z1231 Encounter for screening mammogram for malignant neoplasm of breast: Secondary | ICD-10-CM

## 2023-07-14 NOTE — Telephone Encounter (Signed)
 LM for patient letting her know that order was in for mammo and was able to call and get that scheduled.

## 2023-08-01 ENCOUNTER — Other Ambulatory Visit: Payer: Self-pay | Admitting: Licensed Practical Nurse

## 2023-08-01 ENCOUNTER — Other Ambulatory Visit (HOSPITAL_COMMUNITY)
Admission: RE | Admit: 2023-08-01 | Discharge: 2023-08-01 | Disposition: A | Discharge status: Home / Self Care | Source: Ambulatory Visit | Attending: Licensed Practical Nurse | Admitting: Licensed Practical Nurse

## 2023-08-01 ENCOUNTER — Ambulatory Visit (INDEPENDENT_AMBULATORY_CARE_PROVIDER_SITE_OTHER): Admitting: Licensed Practical Nurse

## 2023-08-01 VITALS — BP 124/79 | HR 91 | Ht 67.0 in | Wt 150.1 lb

## 2023-08-01 DIAGNOSIS — N63 Unspecified lump in unspecified breast: Secondary | ICD-10-CM

## 2023-08-01 DIAGNOSIS — Z13220 Encounter for screening for lipoid disorders: Secondary | ICD-10-CM

## 2023-08-01 DIAGNOSIS — Z01419 Encounter for gynecological examination (general) (routine) without abnormal findings: Secondary | ICD-10-CM

## 2023-08-01 DIAGNOSIS — Z124 Encounter for screening for malignant neoplasm of cervix: Secondary | ICD-10-CM | POA: Insufficient documentation

## 2023-08-01 DIAGNOSIS — R5383 Other fatigue: Secondary | ICD-10-CM

## 2023-08-01 DIAGNOSIS — Z131 Encounter for screening for diabetes mellitus: Secondary | ICD-10-CM

## 2023-08-01 DIAGNOSIS — N952 Postmenopausal atrophic vaginitis: Secondary | ICD-10-CM

## 2023-08-01 DIAGNOSIS — R102 Pelvic and perineal pain unspecified side: Secondary | ICD-10-CM

## 2023-08-01 NOTE — Progress Notes (Unsigned)
 Gynecology Annual Exam  PCP: Frederich Jaksch, CNM  Chief Complaint:  Chief Complaint  Patient presents with   Gynecologic Exam    History of Present Illness:Patient is a 58 y.o. G1P0 presents for annual exam. The patient has no complaints today.   LMP: No LMP recorded. Patient is postmenopausal. Menarche:{numbers 0-45:40981} Average Interval: {Desc; regular/irreg:14544}, {numbers 22-35:14824} days Duration of flow: {numbers; 0-10:33138} days Heavy Menses: {yes/no:63} Clots: {yes/no:63} Intermenstrual Bleeding: {yes/no:63} Postcoital Bleeding: {yes/no:63} Dysmenorrhea: {yes/no:63}  The patient {sys sexually active:13135} sexually active. She {has/denies:315300} dyspareunia.  The patient {DOES_DOES XBJ:47829} perform self breast exams.  There {is/is no:19420} notable family history of breast or ovarian cancer in her family.  The patient wears seatbelts: {yes/no:63}.   The patient has regular exercise: {yes/no/not asked:9010}.    The patient {Blank single:19197::"reports","denies"} current symptoms of depression.     Review of Systems: ROS  Past Medical History:  Patient Active Problem List   Diagnosis Date Noted Date Diagnosed   Dyspareunia in female 05/21/2022     Past Surgical History:  Past Surgical History:  Procedure Laterality Date   dental implant      Gynecologic History:  No LMP recorded. Patient is postmenopausal. Last Pap: Results were: *** {Findings; lab pap smear results:16707::"NIL and HR HPV+","NIL and HR HPV negative"}  Last mammogram: *** Results were: {Blank single:19197::"***","BI-RADS IV","BI-RAD III","BI-RAD II","BI-RAD I"}  Obstetric History: G1P0  Family History:  Family History  Problem Relation Age of Onset   Skin cancer Father 53   Cancer Paternal Aunt 31   Colon cancer Paternal Grandmother 40   Breast cancer Mother 6       passed at 38    Social History:  Social History   Socioeconomic History   Marital status:  Married    Spouse name: Not on file   Number of children: Not on file   Years of education: Not on file   Highest education level: Not on file  Occupational History   Not on file  Tobacco Use   Smoking status: Former   Smokeless tobacco: Never  Vaping Use   Vaping status: Never Used  Substance and Sexual Activity   Alcohol use: Yes   Drug use: No   Sexual activity: Yes    Partners: Male    Birth control/protection: None  Other Topics Concern   Not on file  Social History Narrative   Not on file   Social Drivers of Health   Financial Resource Strain: Not on file  Food Insecurity: Not on file  Transportation Needs: Not on file  Physical Activity: Not on file  Stress: Not on file  Social Connections: Not on file  Intimate Partner Violence: Not on file    Allergies:  No Known Allergies  Medications: Prior to Admission medications   Medication Sig Start Date End Date Taking? Authorizing Provider  baclofen  (LIORESAL ) 10 MG tablet TAKE 1 TABLET (10 MG TOTAL) BY MOUTH EVERY 8 (EIGHT) HOURS AS NEEDED FOR MUSCLE SPASMS (PAIN). 04/28/18  Yes Bridget Campion, MD  benzonatate (TESSALON) 200 MG capsule Take 200 mg by mouth 3 (three) times daily as needed for cough.   Yes [provider]  estradiol  (ESTRACE  VAGINAL) 0.1 MG/GM vaginal cream Place 1 g vaginally 3 (three) times a week. 05/08/22  Yes Arieonna Medine, Alva Jewels, CNM  ibuprofen (ADVIL,MOTRIN) 200 MG tablet Take 800 mg by mouth every 6 (six) hours as needed for moderate pain.   Yes [provider]  celecoxib (CELEBREX)  200 MG capsule TAKE 1 CAPSULE BY MOUTH TWICE A DAY Patient not taking: Reported on 04/03/2022 11/04/18   [provider]  cyclobenzaprine (FLEXERIL) 5 MG tablet Take by mouth. Patient not taking: Reported on 04/03/2022    [provider]  diazepam  (VALIUM ) 5 MG tablet Take by mouth. Patient not taking: Reported on 04/03/2022    [provider]  predniSONE (DELTASONE) 20 MG  tablet Take 20 mg by mouth daily with breakfast. Patient not taking: Reported on 08/01/2023    [provider]    Physical Exam Vitals: Blood pressure 124/79, pulse 91, height 5\' 7"  (1.702 m), weight 150 lb 1.6 oz (68.1 kg).  General: NAD HEENT: normocephalic, anicteric Thyroid : no enlargement, no palpable nodules Pulmonary: No increased work of breathing, CTAB Cardiovascular: RRR, distal pulses 2+ Breast: Breast symmetrical, no tenderness, no palpable nodules or masses, no skin or nipple retraction present, no nipple discharge.  No axillary or supraclavicular lymphadenopathy. Abdomen: NABS, soft, non-tender, non-distended.  Umbilicus without lesions.  No hepatomegaly, splenomegaly or masses palpable. No evidence of hernia  Genitourinary:  External: Normal external female genitalia.  Normal urethral meatus, normal Bartholin's and Skene's glands.    Vagina: Normal vaginal mucosa, no evidence of prolapse.    Cervix: Grossly normal in appearance, no bleeding  Uterus: Non-enlarged, mobile, normal contour.  No CMT  Adnexa: ovaries non-enlarged, no adnexal masses  Rectal: deferred  Lymphatic: no evidence of inguinal lymphadenopathy Extremities: no edema, erythema, or tenderness Neurologic: Grossly intact Psychiatric: mood appropriate, affect full  Female chaperone present for pelvic and breast  portions of the physical exam     Assessment: 58 y.o. G1P0 routine annual exam  Plan: Problem List Items Addressed This Visit   None Visit Diagnoses       Lump in female breast    -  Primary   Relevant Orders   MM Digital Diagnostic Bilat     Well woman exam       Relevant Orders   Lipid panel   Hemoglobin A1c   TSH + free T4   Cytology - PAP     Screening for diabetes mellitus       Relevant Orders   Hemoglobin A1c     Screening for cholesterol level       Relevant Orders   Lipid panel     Other fatigue       Relevant Orders   TSH + free T4     Cervical cancer screening        Relevant Orders   Cytology - PAP       1) Mammogram - recommend yearly screening mammogram.  Mammogram {Blank single:19197::"Is up to date","Was ordered today"}  2) STI screening  {Blank single:19197::"was","was not"}offered and {Blank single:19197::"accepted","declined","therefore not obtained"}  3) ASCCP guidelines and rational discussed.  Patient opts for {Blank single:19197::"***","every 5 years","every 3 years","yearly","discontinue age >65","discontinue secondary to prior hysterectomy"} screening interval  4) Osteoporosis  - per USPTF routine screening DEXA at age 84 - FRAX 10 year major fracture risk ***,  10 year hip fracture risk ***  Consider FDA-approved medical therapies in postmenopausal women and men aged 84 years and older, based on the following: a) A hip or vertebral (clinical or morphometric) fracture b) T-score <= -2.5 at the femoral neck or spine after appropriate evaluation to exclude secondary causes C) Low bone mass (T-score between -1.0 and -2.5 at the femoral neck or spine) and a 10-year probability of a hip fracture >= 3% or  a 10-year probability of a major osteoporosis-related fracture >= 20% based on the [place] -adapted WHO algorithm   5) Routine healthcare maintenance including cholesterol, diabetes screening discussed {Blank single:19197::"managed by PCP","Ordered today","To return fasting at a later date","Declines"}  6) Colonoscopy ***.  Screening recommended starting at age 60 for average risk individuals, age 78 for individuals deemed at increased risk (including African Americans) and recommended to continue until age 91.  For patient age 47-85 individualized approach is recommended.  Gold standard screening is via colonoscopy, Cologuard screening is an acceptable alternative for patient unwilling or unable to undergo colonoscopy.  "Colorectal cancer screening for average?risk adults: 2018 guideline update from the American Cancer Society"CA: A Cancer  Journal for Clinicians: Aug 28, 2016   7) No follow-ups on file.   Anice Kerbs, CNM  Andrey Keas, [facility] 08/01/2023, 3:53 PM

## 2023-08-02 ENCOUNTER — Encounter: Payer: Self-pay | Admitting: Licensed Practical Nurse

## 2023-08-02 LAB — LIPID PANEL
Chol/HDL Ratio: 2.9 ratio (ref 0.0–4.4)
Cholesterol, Total: 163 mg/dL (ref 100–199)
HDL: 57 mg/dL (ref >39)
LDL Chol Calc (NIH): 87 mg/dL (ref 0–99)
Triglycerides: 106 mg/dL (ref 0–149)
VLDL Cholesterol Cal: 19 mg/dL (ref 5–40)

## 2023-08-02 LAB — TSH+FREE T4
Free T4: 1.11 ng/dL (ref 0.82–1.77)
TSH: 1.02 u[IU]/mL (ref 0.450–4.500)

## 2023-08-02 LAB — HEMOGLOBIN A1C
Est. average glucose Bld gHb Est-mCnc: 111 mg/dL
Hgb A1c MFr Bld: 5.5 % (ref 4.8–5.6)

## 2023-08-07 ENCOUNTER — Ambulatory Visit
Admission: RE | Admit: 2023-08-07 | Discharge: 2023-08-07 | Disposition: A | Discharge status: Home / Self Care | Source: Ambulatory Visit | Attending: Licensed Practical Nurse | Admitting: Licensed Practical Nurse

## 2023-08-07 ENCOUNTER — Encounter: Payer: Self-pay | Admitting: Licensed Practical Nurse

## 2023-08-07 DIAGNOSIS — N63 Unspecified lump in unspecified breast: Secondary | ICD-10-CM

## 2023-08-07 LAB — CYTOLOGY - PAP
Comment: NEGATIVE
Diagnosis: NEGATIVE
High risk HPV: NEGATIVE

## 2023-08-12 ENCOUNTER — Encounter

## 2023-10-01 ENCOUNTER — Other Ambulatory Visit: Payer: Self-pay | Admitting: Student

## 2023-10-01 DIAGNOSIS — M47816 Spondylosis without myelopathy or radiculopathy, lumbar region: Secondary | ICD-10-CM

## 2023-10-01 DIAGNOSIS — M4807 Spinal stenosis, lumbosacral region: Secondary | ICD-10-CM

## 2023-10-01 DIAGNOSIS — M5416 Radiculopathy, lumbar region: Secondary | ICD-10-CM

## 2023-10-02 ENCOUNTER — Ambulatory Visit
Admission: RE | Admit: 2023-10-02 | Discharge: 2023-10-02 | Disposition: A | Discharge status: Home / Self Care | Source: Ambulatory Visit | Attending: Student | Admitting: Student

## 2023-10-02 DIAGNOSIS — M4807 Spinal stenosis, lumbosacral region: Secondary | ICD-10-CM | POA: Insufficient documentation

## 2023-10-02 DIAGNOSIS — M47816 Spondylosis without myelopathy or radiculopathy, lumbar region: Secondary | ICD-10-CM | POA: Diagnosis present

## 2023-10-02 DIAGNOSIS — M5416 Radiculopathy, lumbar region: Secondary | ICD-10-CM | POA: Diagnosis present

## 2023-11-03 ENCOUNTER — Inpatient Hospital Stay
Admission: RE | Admit: 2023-11-03 | Discharge: 2023-11-03 | Disposition: A | Discharge status: Home / Self Care | Payer: Self-pay | Source: Ambulatory Visit | Attending: Neurosurgery | Admitting: Neurosurgery

## 2023-11-03 ENCOUNTER — Other Ambulatory Visit: Payer: Self-pay | Admitting: Family Medicine

## 2023-11-03 DIAGNOSIS — Z049 Encounter for examination and observation for unspecified reason: Secondary | ICD-10-CM

## 2023-11-11 ENCOUNTER — Encounter: Payer: Self-pay | Admitting: Neurosurgery

## 2023-11-11 NOTE — Progress Notes (Signed)
 Referring Physician:  Avanell Katz, MD [address] [place],  [place] [address]  Primary Physician:  Delinda Jinnie Jansky, CNM  History of Present Illness: 11/12/2023 Angela Humphrey is here today with a chief complaint of left-sided lumbosacral radiculopathy.  She has had a chronic back pain and has been managed conservatively.  She remains quite active and has been otherwise healthy, she continues to actively train horses.  In June of this year early in the week she had severe low back pain, this caused severe pain in her left lower extremity with radiation down her back of her lower extremity to the side of her foot.  She originally had burning pain and tingling, but since has had steroid treatments and injections since 09/02/2023.  She does have weakness and has had a impact on her quality of life as well as her ability to function as she noticed that she is having difficulty pushing up off the ground with her left foot.  She also has noticed that she has been limping with her stride.  Conservative measures: chiropractor Physical therapy: No formal physical therapy Multimodal medical therapy including regular antiinflammatories:  Celebrex, Tizanidine, Gabapentin, Prednisone, Ibuprofen, Naproxen Injections:  03/10/2023-Large Joint Injection: L greater trochanteric bursa  10/27/2023: Left S1 transforaminal ESI   Past Surgery: no spine surgery  The symptoms are causing a significant impact on the patient's life and her weakness is impairing her function  I have utilized the care everywhere function in epic to review the outside records available from external health systems.  Recently reviewed her orthopedic surgery evaluation which at that time demonstrated full strength to confrontation.  Review of Systems:  A 10 point review of systems is negative, except for the pertinent positives and negatives detailed in the HPI.  Past Medical History: Past Medical History:   Diagnosis Date   Chronic back pain     Past Surgical History: Past Surgical History:  Procedure Laterality Date   dental implant     TONSILECTOMY, ADENOIDECTOMY, BILATERAL MYRINGOTOMY AND TUBES      Allergies: Allergies as of 11/12/2023 - Review Complete 11/12/2023  Allergen Reaction Noted   Biotin Hives 11/12/2023    Medications:  Current Outpatient Medications:    baclofen  (LIORESAL ) 10 MG tablet, TAKE 1 TABLET (10 MG TOTAL) BY MOUTH EVERY 8 (EIGHT) HOURS AS NEEDED FOR MUSCLE SPASMS (PAIN)., Disp: 60 tablet, Rfl: 3   celecoxib (CELEBREX) 200 MG capsule, TAKE 1 CAPSULE BY MOUTH TWICE A DAY, Disp: , Rfl:    estradiol  (ESTRACE  VAGINAL) 0.1 MG/GM vaginal cream, Place 1 g vaginally 3 (three) times a week., Disp: 42.5 g, Rfl: 3   gabapentin (NEURONTIN) 300 MG capsule, Take 300 mg by mouth 2 (two) times daily as needed., Disp: , Rfl:    ibuprofen (ADVIL,MOTRIN) 200 MG tablet, Take 800 mg by mouth every 6 (six) hours as needed for moderate pain., Disp: , Rfl:    Oxymetazoline HCl (RHOFADE) 1 % CREA, APPLY ONE PEA SIZE AMOUNT ON EACH CHEEK AND SPREAD TO ALL AFFECTED AREAS EVERY MORNING, Disp: , Rfl:    SEMAGLUTIDE-WEIGHT MANAGEMENT [place], Inject into the skin once a week., Disp: , Rfl:    tiZANidine (ZANAFLEX) 4 MG tablet, Take 4 mg by mouth 3 (three) times daily., Disp: , Rfl:   Current Facility-Administered Medications:    methylPREDNISolone  acetate (DEPO-MEDROL ) injection 80 mg, 80 mg, Other, Once, Eldonna Novel, MD  Social History: Social History   Tobacco Use   Smoking status: Former  Types: Cigarettes   Smokeless tobacco: Never  Vaping Use   Vaping status: Never Used  Substance Use Topics   Alcohol use: Yes   Drug use: No    Family Medical History: Family History  Problem Relation Age of Onset   Skin cancer Father 69   Cancer Paternal Aunt 57   Colon cancer Paternal Grandmother 1   Breast cancer Mother 69       passed at 45    Physical Examination: Vitals:    11/12/23 1414  BP: 110/62    General: Patient is in no apparent distress. Attention to examination is appropriate.  Neck:   Supple.  Full range of motion.  Respiratory: Patient is breathing without any difficulty.   NEUROLOGICAL:     Awake, alert, oriented to person, place, and time.  Speech is clear and fluent.   Cranial Nerves: Pupils equal round and reactive to light.  Facial tone is symmetric.  Facial sensation is symmetric. Shoulder shrug is symmetric. Tongue protrusion is midline.    Strength:  Side Iliopsoas Quads Hamstring PF DF EHL  R 5 5 5 5 5 5   L 5 5 4+ 2-3* 5 5   Reflexes are 2+ at the bilateral patellas, 2+ at the right medial hamstring, 1+ at the left medial hamstring, 2+ at the right Achilles, absent at the left Achilles  No major sensory deficit noted     Gait is abnormal, has a decreased pushoff noted on the left foot, also has evidence of hip drop when trying to stand on her left foot alone.  Trendelenburg sign positive.  Imaging: Narrative & Impression  EXAM DESCRIPTION: MR LUMBAR SPINE WO CONTRAST   CLINICAL HISTORY: Back pain.   COMPARISON: None Available.   TECHNIQUE: MRI of the lumbar spine is performed according to our usual protocol with axial and sagittal multi sequence imaging.   FINDINGS: The alignment is unremarkable. Mild degenerative disc disease L3-4 and L4-5. No endplate marrow edema. The marrow signal is unremarkable as well as the conus and cauda equina.   L3-4 has mild lateral recess narrowing on the right due to broad-based disc bulge and facet hypertrophy.   L4-5 has mild foraminal and lateral recess narrowing on the right due to broad-based disc bulge and facet hypertrophy.   L5-S1 has a left paracentral disc extrusion migrating cranially behind the inferior endplate of L5. This causes moderate to severe lateral recess narrowing on the left with suspected impingement of the left S1 nerve. There is a small left proximal  foraminal component contributing to mild-to-moderate foraminal narrowing as well a least crowding the exiting L5 nerve.   The imaged levels are otherwise unremarkable.   The imaged retroperitoneal structures are unremarkable.     IMPRESSION: L5-S1 has a left paracentral disc extrusion migrating cranially behind the inferior endplate of L5. Suspected impingement of the left S1 nerve. There is a small proximal left foraminal component with possible contact of the exiting L5 nerve.   Electronically signed by: Reyes Frees MD 10/03/2023 12:37 AM EDT RP Workstation: [id number]   I have personally reviewed the images and agree with the above interpretation.  Medical Decision Making/Assessment and Plan: Angela Humphrey is a pleasant 58 y.o. female with a left-sided lumbosacral radiculopathy.  She has had this since early June 2025.  Developed severe pain numbness and tingling in her left buttocks region down her left lower extremity.  She has had chronic back pain but this was new.  She unfortunately has developed  weakness in her left lower extremity.  Her sensation has been mostly intact.  Her pain has been better controlled with oral steroids as well as injection steroids.  Her pain can still get up to 8-9 out of 10 intermittently.  Physical examination she is unable to support her body weight in her left lower extremity.  She does not have full antigravity strength in her left plantarflexion.  Her toe flexion is also 4- out of 5 on the left.  She has an absent left Achilles reflex.  Positive straight leg raise.  Positive Trendelenburg sign.  She has weakness to confrontation as well in her plantarflexion.  Her MRI demonstrates a large left-sided paracentral disc herniation at L5-S1 with some cranial migration compressing the S1 nerve root.  This clearly correlates with her clinical symptomatology and demonstrates a S1 radiculopathy with progressive weakness, she was not seen to have weakness at her  orthopedics visit 2 weeks ago and now clearly cannot toe walk or support her weight on her left foot.  In summary she has a symptomatic left-sided lumbosacral radiculopathy with weakness, loss of Achilles reflex, gait abnormality including positive Trendelenburg sign with a hip drop and functional impairment.  Because of this we are recommending a left-sided L5-S1 microdiscectomy.  She had her last steroid injection approximately 2 weeks ago in order to mitigate infectious disease risk we will plan on having her scheduled in approximately 4 weeks.  We discussed risk and benefits of surgery, we also discussed that given the significant weakness noted on physical examination that she may not regain full strength after surgery.  We discussed that she would need to restrict and hold off from her horseback riding in the postoperative period.  Thank you for involving me in the care of this patient.    Penne MICAEL Sharps MD/MSCR Neurosurgery

## 2023-11-12 ENCOUNTER — Other Ambulatory Visit: Payer: Self-pay

## 2023-11-12 ENCOUNTER — Ambulatory Visit (INDEPENDENT_AMBULATORY_CARE_PROVIDER_SITE_OTHER): Admitting: Neurosurgery

## 2023-11-12 ENCOUNTER — Encounter: Payer: Self-pay | Admitting: Neurosurgery

## 2023-11-12 VITALS — BP 110/62 | Ht 66.5 in | Wt 142.5 lb

## 2023-11-12 DIAGNOSIS — M5117 Intervertebral disc disorders with radiculopathy, lumbosacral region: Secondary | ICD-10-CM | POA: Diagnosis not present

## 2023-11-12 DIAGNOSIS — M5106 Intervertebral disc disorders with myelopathy, lumbar region: Secondary | ICD-10-CM | POA: Insufficient documentation

## 2023-11-12 DIAGNOSIS — R292 Abnormal reflex: Secondary | ICD-10-CM

## 2023-11-12 DIAGNOSIS — M5416 Radiculopathy, lumbar region: Secondary | ICD-10-CM

## 2023-11-12 DIAGNOSIS — Z01818 Encounter for other preprocedural examination: Secondary | ICD-10-CM

## 2023-11-12 NOTE — Patient Instructions (Signed)
 Please see below for information in regards to your upcoming surgery:   Planned surgery: Lumbar laminectomy and discectomy, L5-S1, left sided   Surgery date: 12/23/23 at [facility] (Medical Mall: [address] [place], [place] [address]) - you will find out your arrival time the business day before your surgery.   Pre-op appointment at [facility] Pre-admit Testing: you will receive a call with a date/time for this appointment. If you are scheduled for an in person appointment, Pre-admit Testing is located on the first floor of the [facility] building, [address], [address]. During this appointment, they will advise you which medications you can take the morning of surgery, and which medications you will need to hold for surgery. Labs (such as blood work, EKG) may be done at your pre-op appointment. You are not required to fast for these labs. Should you need to change your pre-op appointment, please call Pre-admit testing at [phone].     Diabetes/heart failure/kidney disease/weight loss medications that require an extended hold: Per anesthesia guidelines (due to the increased risk of aspiration caused by delayed gastric emptying):  Semaglutide injections: hold for 7 days prior to surgery     Common restrictions after spine surgery: No bending, lifting, or twisting ("BLT"). Avoid lifting objects heavier than 10 pounds for the first 6 weeks after surgery. Where possible, avoid household activities that involve lifting, bending, reaching, pushing, or pulling such as laundry, vacuuming, grocery shopping, and childcare. Try to arrange for help from friends and family for these activities while you heal. Do not drive while taking prescription pain medication. Weeks 6 through 12 after surgery: avoid lifting more than 25 pounds.     How to contact us :  If you have any questions/concerns before or after surgery, you can reach us  at [phone], or  you can send a mychart message. We can be reached by phone or mychart 8am-4pm, Monday-Friday.  *Please note: Calls after 4pm are forwarded to a third party answering service. Mychart messages are not routinely monitored during evenings, weekends, and holidays. Please call our office to contact the answering service for urgent concerns during non-business hours.    If you have FMLA/disability paperwork, please drop it off or fax it to [phone]   Appointments/FMLA & disability paperwork: [employer] Registered Nurse/Surgery scheduler: Katilynn Sinkler, RN Certified Medical Assistants: Don, CMA, Elenor, CMA, & Damien, CMA Physician Assistants: Lyle Decamp, PA-C, Edsel Goods, PA-C & Glade Boys, PA-C Surgeons: Penne Sharps, MD & Reeves Daisy, MD   [facility] PREADMIT TESTING VISIT and SURGERY INFORMATION SHEET   Now that surgery has been scheduled you can anticipate several phone calls from [facility] services. A pharmacy technician will call you to verify your current list of medications taken at home.               The Pre-Service Center will call to verify your insurance information and to give you billing estimates and information.             The Preadmit Testing Office will be calling to schedule a visit to obtain information for the anesthesia team and provide instructions on preparation for surgery.  What can you expect for the Preadmit Testing Visit: Appointments may be scheduled in-person or by telephone.  If a telephone visit is scheduled, you may be asked to come into the office to have lab tests or other studies performed.   This visit will not be completed any greater  than 14 days prior to your surgery.  If your surgery has been scheduled for a future date, please do not be alarmed if we have not contacted you to schedule an appointment more than a month prior to the surgery date.    Please be prepared to provide the following information  during this appointment:            -Personal medical history                                               -Medication and allergy list            -Any history of problems with anesthesia              -Recent lab work or diagnostic studies            -Please notify us  of any needs we should be aware of to provide the best care possible           -You will be provided with instructions on how to prepare for your surgery.    On The Day of Surgery:  You must have a driver to take you home after surgery, you will be asked not to drive for 24 hours following surgery.  Taxi, Gisele and non-medical transport will not be acceptable means of transportation unless you have a responsible individual who will be traveling with you.  Visitors in the surgical area:   2 people will be able to visit you in your room once your preparation for surgery has been completed. During surgery, your visitors will be asked to wait in the Surgery Waiting Area.  It is not a requirement for them to stay, if they prefer to leave and come back.  Your visitor(s) will be given an update once the surgery has been completed.  No visitors are allowed in the initial recovery room to respect patient privacy and safety.  Once you are more awake and transfer to the secondary recovery area, or are transferred to an inpatient room, visitors will again be able to see you.  To respect and protect your privacy: We will ask on the day of surgery who your driver will be and what the contact number for that individual will be. We will ask if it is okay to share information with this individual, or if there is an alternative individual that we, or the surgeon, should contact to provide updates and information. If family or friends come to the surgical information desk requesting information about you, who you have not listed with us , no information will be given.   It may be helpful to designate someone as the main contact who will be  responsible for updating your other friends and family.    PREADMIT TESTING OFFICE: [phone] SAME DAY SURGERY: [phone] We look forward to caring for you before and throughout the process of your surgery.

## 2023-11-24 ENCOUNTER — Ambulatory Visit: Admitting: Physician Assistant

## 2023-11-25 ENCOUNTER — Ambulatory Visit: Admitting: Physician Assistant

## 2023-12-12 ENCOUNTER — Encounter
Admission: RE | Admit: 2023-12-12 | Discharge: 2023-12-12 | Disposition: A | Discharge status: Home / Self Care | Source: Ambulatory Visit | Attending: Neurosurgery | Admitting: Neurosurgery

## 2023-12-12 ENCOUNTER — Other Ambulatory Visit: Payer: Self-pay

## 2023-12-12 VITALS — BP 102/65 | HR 69 | Resp 16 | Wt 141.5 lb

## 2023-12-12 DIAGNOSIS — M5416 Radiculopathy, lumbar region: Secondary | ICD-10-CM | POA: Diagnosis not present

## 2023-12-12 DIAGNOSIS — M5106 Intervertebral disc disorders with myelopathy, lumbar region: Secondary | ICD-10-CM | POA: Diagnosis not present

## 2023-12-12 DIAGNOSIS — Z01810 Encounter for preprocedural cardiovascular examination: Secondary | ICD-10-CM

## 2023-12-12 DIAGNOSIS — Z01818 Encounter for other preprocedural examination: Secondary | ICD-10-CM | POA: Insufficient documentation

## 2023-12-12 DIAGNOSIS — Z01812 Encounter for preprocedural laboratory examination: Secondary | ICD-10-CM

## 2023-12-12 HISTORY — DX: Unspecified osteoarthritis, unspecified site: M19.90

## 2023-12-12 HISTORY — DX: Family history of other specified conditions: Z84.89

## 2023-12-12 LAB — CBC
HCT: 36.5 % (ref 36.0–46.0)
Hemoglobin: 12.2 g/dL (ref 12.0–15.0)
MCH: 28.9 pg (ref 26.0–34.0)
MCHC: 33.4 g/dL (ref 30.0–36.0)
MCV: 86.5 fL (ref 80.0–100.0)
Platelets: 216 K/uL (ref 150–400)
RBC: 4.22 MIL/uL (ref 3.87–5.11)
RDW: 13.2 % (ref 11.5–15.5)
WBC: 6.4 K/uL (ref 4.0–10.5)
nRBC: 0 % (ref 0.0–0.2)

## 2023-12-12 LAB — BASIC METABOLIC PANEL WITH GFR
Anion gap: 7 (ref 5–15)
BUN: 27 mg/dL — ABNORMAL HIGH (ref 6–20)
CO2: 27 mmol/L (ref 22–32)
Calcium: 9.1 mg/dL (ref 8.9–10.3)
Chloride: 106 mmol/L (ref 98–111)
Creatinine, Ser: 0.71 mg/dL (ref 0.44–1.00)
GFR, Estimated: >60 mL/min (ref >60)
Glucose, Bld: 89 mg/dL (ref 70–99)
Potassium: 4.1 mmol/L (ref 3.5–5.1)
Sodium: 140 mmol/L (ref 135–145)

## 2023-12-12 LAB — URINALYSIS, COMPLETE (UACMP) WITH MICROSCOPIC
Bacteria, UA: NONE SEEN
Bilirubin Urine: NEGATIVE
Glucose, UA: NEGATIVE mg/dL
Hgb urine dipstick: NEGATIVE
Ketones, ur: NEGATIVE mg/dL
Leukocytes,Ua: NEGATIVE
Nitrite: NEGATIVE
Protein, ur: NEGATIVE mg/dL
Specific Gravity, Urine: 1.026 (ref 1.005–1.030)
pH: 5 (ref 5.0–8.0)

## 2023-12-12 LAB — SURGICAL PCR SCREEN
MRSA, PCR: NEGATIVE
Staphylococcus aureus: NEGATIVE

## 2023-12-12 LAB — TYPE AND SCREEN
ABO/RH(D): B POS
Antibody Screen: NEGATIVE

## 2023-12-12 NOTE — Patient Instructions (Addendum)
 Your procedure is scheduled on: 12/23/23 - Tuesday Report to the Registration Desk on the 1st floor of the [facility]. To find out your arrival time, please call [phone] between 1PM - 3PM on: 12/22/23 - Monday If your arrival time is 6:00 am, do not arrive before that time as the [facility] entrance doors do not open until 6:00 am.  REMEMBER: Instructions that are not followed completely may result in serious medical risk, up to and including death; or upon the discretion of your surgeon and anesthesiologist your surgery may need to be rescheduled.  Do not eat food after midnight the night before surgery.  No gum chewing or hard candies.  You may however, drink CLEAR liquids up to 2 hours before you are scheduled to arrive for your surgery. Do not drink anything within 2 hours of your scheduled arrival time.  Clear liquids include: - water  - apple juice without pulp - gatorade (not RED colors) - black coffee or tea (Do NOT add milk or creamers to the coffee or tea) Do NOT drink anything that is not on this list.  You may continue if needed, Anti-inflammatories (NSAIDS) such as Advil, Aleve, Ibuprofen, Motrin, Naproxen, Naprosyn and Aspirin based products such as Excedrin, Goody's Powder, BC Powder, celecoxib (CELEBREX) and gabapentin (NEURONTIN) . You may take Tylenol  if needed for pain up until the day of surgery.  Stop taking beginning 09/16, ANY OVER THE COUNTER supplements until after surgery.  Semaglutide injections: hold for 7 days prior to surgery   ON THE DAY OF SURGERY ONLY TAKE THESE MEDICATIONS WITH SIPS OF WATER:  none   No Alcohol for 24 hours before or after surgery.  No Smoking including e-cigarettes for 24 hours before surgery.  No chewable tobacco products for at least 6 hours before surgery.  No nicotine patches on the day of surgery.  Do not use any recreational drugs for at least a week (preferably 2 weeks) before your surgery.  Please be  advised that the combination of cocaine and anesthesia may have negative outcomes, up to and including death. If you test positive for cocaine, your surgery will be cancelled.  On the morning of surgery brush your teeth with toothpaste and water, you may rinse your mouth with mouthwash if you wish. Do not swallow any toothpaste or mouthwash.  Use CHG Soap or wipes as directed on instruction sheet.  Do not wear jewelry, make-up, hairpins, clips or nail polish.  For welded (permanent) jewelry: bracelets, anklets, waist bands, etc.  Please have this removed prior to surgery.  If it is not removed, there is a chance that hospital personnel will need to cut it off on the day of surgery.  Do not wear lotions, powders, or perfumes.   Do not shave body hair from the neck down 48 hours before surgery.  Contact lenses, hearing aids and dentures may not be worn into surgery.  Do not bring valuables to the hospital. [facility] is not responsible for any missing/lost belongings or valuables.   Notify your doctor if there is any change in your medical condition (cold, fever, infection).  Wear comfortable clothing (specific to your surgery type) to the hospital.  After surgery, you can help prevent lung complications by doing breathing exercises.  Take deep breaths and cough every 1-2 hours. Your doctor may order a device called an Incentive Spirometer to help you take deep breaths.  When coughing or sneezing, hold a pillow firmly against your incision with  both hands. This is called "splinting." Doing this helps protect your incision. It also decreases belly discomfort.  If you are being admitted to the hospital overnight, leave your suitcase in the car. After surgery it may be brought to your room.  In case of increased patient census, it may be necessary for you, the patient, to continue your postoperative care in the Same Day Surgery department.  If you are being discharged the day of surgery,  you will not be allowed to drive home. You will need a responsible individual to drive you home and stay with you for 24 hours after surgery.   If you are taking public transportation, you will need to have a responsible individual with you.  Please call the Pre-admissions Testing Dept. at [phone] if you have any questions about these instructions.  Surgery Visitation Policy:  Patients having surgery or a procedure may have two visitors.  Children under the age of 39 must have an adult with them who is not the patient.  Inpatient Visitation:    Visiting hours are 7 a.m. to 8 p.m. Up to four visitors are allowed at one time in a patient room. The visitors may rotate out with other people during the day.  One visitor age 65 or older may stay with the patient overnight and must be in the room by 8 p.m.   Merchandiser, retail to address health-related social needs:  [email or web address]    Pre-operative 5 CHG Bath Instructions   You can play a key role in reducing the risk of infection after surgery. Your skin needs to be as free of germs as possible. You can reduce the number of germs on your skin by washing with CHG (chlorhexidine gluconate) soap before surgery. CHG is an antiseptic soap that kills germs and continues to kill germs even after washing.   DO NOT use if you have an allergy to chlorhexidine/CHG or antibacterial soaps. If your skin becomes reddened or irritated, stop using the CHG and notify one of our RNs at [phone].   Please shower with the CHG soap starting 4 days before surgery using the following schedule: 09/19 - 09/23.    Please keep in mind the following:  DO NOT shave, including legs and underarms, starting the day of your first shower.   You may shave your face at any point before/day of surgery.  Place clean sheets on your bed the day you start using CHG soap. Use a clean washcloth (not used since being washed) for each  shower. DO NOT sleep with pets once you start using the CHG.   CHG Shower Instructions:  If you choose to wash your hair and private area, wash first with your normal shampoo/soap.  After you use shampoo/soap, rinse your hair and body thoroughly to remove shampoo/soap residue.  Turn the water OFF and apply about 3 tablespoons (45 ml) of CHG soap to a CLEAN washcloth.  Apply CHG soap ONLY FROM YOUR NECK DOWN TO YOUR TOES (washing for 3-5 minutes)  DO NOT use CHG soap on face, private areas, open wounds, or sores.  Pay special attention to the area where your surgery is being performed.  If you are having back surgery, having someone wash your back for you may be helpful. Wait 2 minutes after CHG soap is applied, then you may rinse off the CHG soap.  Pat dry with a clean towel  Put on clean clothes/pajamas   If you choose to wear lotion, please use  ONLY the CHG-compatible lotions on the back of this paper.     Additional instructions for the day of surgery: DO NOT APPLY any lotions, deodorants, cologne, or perfumes.   Put on clean/comfortable clothes.  Brush your teeth.  Ask your nurse before applying any prescription medications to the skin.      CHG Compatible Lotions   Aveeno Moisturizing lotion  Cetaphil Moisturizing Cream  Cetaphil Moisturizing Lotion  Clairol Herbal Essence Moisturizing Lotion, Dry Skin  Clairol Herbal Essence Moisturizing Lotion, Extra Dry Skin  Clairol Herbal Essence Moisturizing Lotion, Normal Skin  Curel Age Defying Therapeutic Moisturizing Lotion with Alpha Hydroxy  Curel Extreme Care Body Lotion  Curel Soothing Hands Moisturizing Hand Lotion  Curel Therapeutic Moisturizing Cream, Fragrance-Free  Curel Therapeutic Moisturizing Lotion, Fragrance-Free  Curel Therapeutic Moisturizing Lotion, Original Formula  Eucerin Daily Replenishing Lotion  Eucerin Dry Skin Therapy Plus Alpha Hydroxy Crme  Eucerin Dry Skin Therapy Plus Alpha Hydroxy Lotion   Eucerin Original Crme  Eucerin Original Lotion  Eucerin Plus Crme Eucerin Plus Lotion  Eucerin TriLipid Replenishing Lotion  Keri Anti-Bacterial Hand Lotion  Keri Deep Conditioning Original Lotion Dry Skin Formula Softly Scented  Keri Deep Conditioning Original Lotion, Fragrance Free Sensitive Skin Formula  Keri Lotion Fast Absorbing Fragrance Free Sensitive Skin Formula  Keri Lotion Fast Absorbing Softly Scented Dry Skin Formula  Keri Original Lotion  Keri Skin Renewal Lotion Keri Silky Smooth Lotion  Keri Silky Smooth Sensitive Skin Lotion  Nivea Body Creamy Conditioning Oil  Nivea Body Extra Enriched Lotion  Nivea Body Original Lotion  Nivea Body Sheer Moisturizing Lotion Nivea Crme  Nivea Skin Firming Lotion  NutraDerm 30 Skin Lotion  NutraDerm Skin Lotion  NutraDerm Therapeutic Skin Cream  NutraDerm Therapeutic Skin Lotion  ProShield Protective Hand Cream  Provon moisturizing lotion  How to Use an Incentive Spirometer  An incentive spirometer is a tool that measures how well you are filling your lungs with each breath. Learning to take long, deep breaths using this tool can help you keep your lungs clear and active. This may help to reverse or lessen your chance of developing breathing (pulmonary) problems, especially infection. You may be asked to use a spirometer: After a surgery. If you have a lung problem or a history of smoking. After a long period of time when you have been unable to move or be active. If the spirometer includes an indicator to show the highest number that you have reached, your health care provider or respiratory therapist will help you set a goal. Keep a log of your progress as told by your health care provider. What are the risks? Breathing too quickly may cause dizziness or cause you to pass out. Take your time so you do not get dizzy or light-headed. If you are in pain, you may need to take pain medicine before doing incentive spirometry. It is  harder to take a deep breath if you are having pain. How to use your incentive spirometer  Sit up on the edge of your bed or on a chair. Hold the incentive spirometer so that it is in an upright position. Before you use the spirometer, breathe out normally. Place the mouthpiece in your mouth. Make sure your lips are closed tightly around it. Breathe in slowly and as deeply as you can through your mouth, causing the piston or the ball to rise toward the top of the chamber. Hold your breath for 3-5 seconds, or for as long as possible. If the spirometer  includes a coach indicator, use this to guide you in breathing. Slow down your breathing if the indicator goes above the marked areas. Remove the mouthpiece from your mouth and breathe out normally. The piston or ball will return to the bottom of the chamber. Rest for a few seconds, then repeat the steps 10 or more times. Take your time and take a few normal breaths between deep breaths so that you do not get dizzy or light-headed. Do this every 1-2 hours when you are awake. If the spirometer includes a goal marker to show the highest number you have reached (best effort), use this as a goal to work toward during each repetition. After each set of 10 deep breaths, cough a few times. This will help to make sure that your lungs are clear. If you have an incision on your chest or abdomen from surgery, place a pillow or a rolled-up towel firmly against the incision when you cough. This can help to reduce pain while taking deep breaths and coughing. General tips When you are able to get out of bed: Walk around often. Continue to take deep breaths and cough in order to clear your lungs. Keep using the incentive spirometer until your health care provider says it is okay to stop using it. If you have been in the hospital, you may be told to keep using the spirometer at home. Contact a health care provider if: You are having difficulty using the  spirometer. You have trouble using the spirometer as often as instructed. Your pain medicine is not giving enough relief for you to use the spirometer as told. You have a fever. Get help right away if: You develop shortness of breath. You develop a cough with bloody mucus from the lungs. You have fluid or blood coming from an incision site after you cough. Summary An incentive spirometer is a tool that can help you learn to take long, deep breaths to keep your lungs clear and active. You may be asked to use a spirometer after a surgery, if you have a lung problem or a history of smoking, or if you have been inactive for a long period of time. Use your incentive spirometer as instructed every 1-2 hours while you are awake. If you have an incision on your chest or abdomen, place a pillow or a rolled-up towel firmly against your incision when you cough. This will help to reduce pain. Get help right away if you have shortness of breath, you cough up bloody mucus, or blood comes from your incision when you cough. This information is not intended to replace advice given to you by your health care provider. Make sure you discuss any questions you have with your health care provider. Document Revised: 06/07/2019 Document Reviewed: 06/07/2019 Elsevier Patient Education  2023 [employer].

## 2023-12-16 ENCOUNTER — Inpatient Hospital Stay: Admission: RE | Admit: 2023-12-16 | Source: Ambulatory Visit

## 2023-12-16 ENCOUNTER — Other Ambulatory Visit

## 2023-12-23 ENCOUNTER — Encounter: Payer: Self-pay | Admitting: Neurosurgery

## 2023-12-23 ENCOUNTER — Other Ambulatory Visit: Payer: Self-pay

## 2023-12-23 ENCOUNTER — Ambulatory Visit
Admission: RE | Admit: 2023-12-23 | Discharge: 2023-12-23 | Disposition: A | Discharge status: Home / Self Care | Attending: Neurosurgery | Admitting: Neurosurgery

## 2023-12-23 ENCOUNTER — Ambulatory Visit

## 2023-12-23 ENCOUNTER — Ambulatory Visit: Payer: Self-pay | Admitting: Urgent Care

## 2023-12-23 ENCOUNTER — Encounter
Admission: RE | Disposition: A | Discharge status: Home / Self Care | Payer: Self-pay | Source: Home / Self Care | Attending: Neurosurgery

## 2023-12-23 ENCOUNTER — Telehealth: Payer: Self-pay | Admitting: Neurosurgery

## 2023-12-23 DIAGNOSIS — G709 Myoneural disorder, unspecified: Secondary | ICD-10-CM | POA: Diagnosis not present

## 2023-12-23 DIAGNOSIS — M5416 Radiculopathy, lumbar region: Secondary | ICD-10-CM

## 2023-12-23 DIAGNOSIS — M5106 Intervertebral disc disorders with myelopathy, lumbar region: Secondary | ICD-10-CM

## 2023-12-23 DIAGNOSIS — Z87891 Personal history of nicotine dependence: Secondary | ICD-10-CM | POA: Diagnosis not present

## 2023-12-23 DIAGNOSIS — M5117 Intervertebral disc disorders with radiculopathy, lumbosacral region: Secondary | ICD-10-CM | POA: Diagnosis present

## 2023-12-23 DIAGNOSIS — M5116 Intervertebral disc disorders with radiculopathy, lumbar region: Secondary | ICD-10-CM

## 2023-12-23 DIAGNOSIS — Z01818 Encounter for other preprocedural examination: Secondary | ICD-10-CM

## 2023-12-23 HISTORY — PX: LUMBAR LAMINECTOMY/DECOMPRESSION MICRODISCECTOMY: SHX5026

## 2023-12-23 LAB — ABO/RH: ABO/RH(D): B POS

## 2023-12-23 SURGERY — LUMBAR LAMINECTOMY/DECOMPRESSION MICRODISCECTOMY 1 LEVEL
Anesthesia: General | Site: Spine Lumbar | Laterality: Left

## 2023-12-23 MED ORDER — PROPOFOL 10 MG/ML IV BOLUS
INTRAVENOUS | Status: AC
  Filled 2023-12-23: qty 20

## 2023-12-23 MED ORDER — HYDROMORPHONE HCL 1 MG/ML IJ SOLN
0.2500 mg | INTRAMUSCULAR | Status: DC | PRN
  Administered 2023-12-23 (×2): 0.5 mg via INTRAVENOUS

## 2023-12-23 MED ORDER — DEXAMETHASONE SODIUM PHOSPHATE 10 MG/ML IJ SOLN
INTRAMUSCULAR | Status: DC | PRN
  Administered 2023-12-23: 10 mg via INTRAVENOUS

## 2023-12-23 MED ORDER — HYDROMORPHONE HCL 1 MG/ML IJ SOLN
INTRAMUSCULAR | Status: AC
  Filled 2023-12-23: qty 1

## 2023-12-23 MED ORDER — BUPIVACAINE LIPOSOME 1.3 % IJ SUSP
INTRAMUSCULAR | Status: AC
  Filled 2023-12-23: qty 10

## 2023-12-23 MED ORDER — GLYCOPYRROLATE 0.2 MG/ML IJ SOLN
INTRAMUSCULAR | Status: DC | PRN
  Administered 2023-12-23: .2 mg via INTRAVENOUS

## 2023-12-23 MED ORDER — BUPIVACAINE HCL (PF) 0.5 % IJ SOLN
INTRAMUSCULAR | Status: AC
  Filled 2023-12-23: qty 30

## 2023-12-23 MED ORDER — BUPIVACAINE-EPINEPHRINE (PF) 0.5% -1:200000 IJ SOLN
INTRAMUSCULAR | Status: DC | PRN
  Administered 2023-12-23: 10 mL

## 2023-12-23 MED ORDER — DROPERIDOL 2.5 MG/ML IJ SOLN
INTRAMUSCULAR | Status: AC
  Filled 2023-12-23: qty 2

## 2023-12-23 MED ORDER — 0.9 % SODIUM CHLORIDE (POUR BTL) OPTIME
TOPICAL | Status: DC | PRN
  Administered 2023-12-23: 500 mL

## 2023-12-23 MED ORDER — METHYLPREDNISOLONE ACETATE 40 MG/ML IJ SUSP
INTRAMUSCULAR | Status: DC | PRN
  Administered 2023-12-23: 40 mg

## 2023-12-23 MED ORDER — ACETAMINOPHEN 10 MG/ML IV SOLN
INTRAVENOUS | Status: DC | PRN
  Administered 2023-12-23: 1000 mg via INTRAVENOUS

## 2023-12-23 MED ORDER — OXYCODONE HCL 5 MG/5ML PO SOLN: 5.0000 mg | Freq: Once | ORAL | Status: AC | PRN

## 2023-12-23 MED ORDER — PROPOFOL 10 MG/ML IV BOLUS
INTRAVENOUS | Status: DC | PRN
  Administered 2023-12-23: 150 ug/kg/min via INTRAVENOUS
  Administered 2023-12-23: 150 mg via INTRAVENOUS

## 2023-12-23 MED ORDER — METHYLPREDNISOLONE 4 MG PO TBPK
ORAL_TABLET | ORAL | 0 refills | Status: DC
  Filled 2023-12-23: qty 21, 6d supply, fill #0

## 2023-12-23 MED ORDER — FENTANYL CITRATE (PF) 100 MCG/2ML IJ SOLN
INTRAMUSCULAR | Status: DC | PRN
  Administered 2023-12-23 (×2): 50 ug via INTRAVENOUS

## 2023-12-23 MED ORDER — PHENYLEPHRINE 80 MCG/ML (10ML) SYRINGE FOR IV PUSH (FOR BLOOD PRESSURE SUPPORT)
PREFILLED_SYRINGE | INTRAVENOUS | Status: DC | PRN
  Administered 2023-12-23 (×2): 80 ug via INTRAVENOUS

## 2023-12-23 MED ORDER — LIDOCAINE HCL (CARDIAC) PF 100 MG/5ML IV SOSY
PREFILLED_SYRINGE | INTRAVENOUS | Status: DC | PRN
  Administered 2023-12-23: 80 mg via INTRAVENOUS

## 2023-12-23 MED ORDER — TIZANIDINE HCL 4 MG PO TABS
4.0000 mg | ORAL_TABLET | Freq: Three times a day (TID) | ORAL | 0 refills | Status: DC | PRN
  Filled 2023-12-23: qty 90, 30d supply, fill #0

## 2023-12-23 MED ORDER — ORAL CARE MOUTH RINSE: 15.0000 mL | Freq: Once | OROMUCOSAL | Status: AC

## 2023-12-23 MED ORDER — FENTANYL CITRATE (PF) 100 MCG/2ML IJ SOLN
INTRAMUSCULAR | Status: AC
  Filled 2023-12-23: qty 2

## 2023-12-23 MED ORDER — SENNA 8.6 MG PO TABS
1.0000 | ORAL_TABLET | Freq: Two times a day (BID) | ORAL | 0 refills | Status: DC | PRN
  Filled 2023-12-23: qty 30, 15d supply, fill #0

## 2023-12-23 MED ORDER — ROCURONIUM BROMIDE 10 MG/ML (PF) SYRINGE
PREFILLED_SYRINGE | INTRAVENOUS | Status: AC
  Filled 2023-12-23: qty 10

## 2023-12-23 MED ORDER — HYDROMORPHONE HCL 1 MG/ML IJ SOLN
INTRAMUSCULAR | Status: DC | PRN
  Administered 2023-12-23: 1 mg via INTRAVENOUS

## 2023-12-23 MED ORDER — LACTATED RINGERS IV SOLN
INTRAVENOUS | Status: DC
Start: 2023-12-23 — End: 2023-12-23

## 2023-12-23 MED ORDER — OXYCODONE HCL 5 MG PO TABS
ORAL_TABLET | ORAL | Status: AC
  Filled 2023-12-23: qty 1

## 2023-12-23 MED ORDER — MIDAZOLAM HCL 2 MG/2ML IJ SOLN
INTRAMUSCULAR | Status: DC | PRN
  Administered 2023-12-23: 2 mg via INTRAVENOUS

## 2023-12-23 MED ORDER — SURGIFLO WITH THROMBIN (HEMOSTATIC MATRIX KIT) OPTIME
TOPICAL | Status: DC | PRN
  Administered 2023-12-23: 1 via TOPICAL

## 2023-12-23 MED ORDER — CHLORHEXIDINE GLUCONATE 0.12 % MT SOLN
OROMUCOSAL | Status: AC
  Filled 2023-12-23: qty 15

## 2023-12-23 MED ORDER — LIDOCAINE HCL (PF) 2 % IJ SOLN
INTRAMUSCULAR | Status: AC
  Filled 2023-12-23: qty 5

## 2023-12-23 MED ORDER — ONDANSETRON HCL 4 MG/2ML IJ SOLN
INTRAMUSCULAR | Status: DC | PRN
  Administered 2023-12-23: 4 mg via INTRAVENOUS

## 2023-12-23 MED ORDER — DROPERIDOL 2.5 MG/ML IJ SOLN
0.6250 mg | Freq: Once | INTRAMUSCULAR | Status: AC | PRN
  Administered 2023-12-23: 0.625 mg via INTRAVENOUS

## 2023-12-23 MED ORDER — SUGAMMADEX SODIUM 200 MG/2ML IV SOLN
INTRAVENOUS | Status: DC | PRN
  Administered 2023-12-23: 150 mg via INTRAVENOUS

## 2023-12-23 MED ORDER — ROCURONIUM BROMIDE 100 MG/10ML IV SOLN
INTRAVENOUS | Status: DC | PRN
  Administered 2023-12-23: 50 mg via INTRAVENOUS
  Administered 2023-12-23 (×3): 20 mg via INTRAVENOUS

## 2023-12-23 MED ORDER — MIDAZOLAM HCL 2 MG/2ML IJ SOLN
INTRAMUSCULAR | Status: AC
  Filled 2023-12-23: qty 2

## 2023-12-23 MED ORDER — SODIUM CHLORIDE (PF) 0.9 % IJ SOLN
INTRAMUSCULAR | Status: DC | PRN
  Administered 2023-12-23: 30 mL via INTRAMUSCULAR

## 2023-12-23 MED ORDER — EPHEDRINE SULFATE-NACL 50-0.9 MG/10ML-% IV SOSY
PREFILLED_SYRINGE | INTRAVENOUS | Status: DC | PRN
  Administered 2023-12-23: 5 mg via INTRAVENOUS

## 2023-12-23 MED ORDER — OXYCODONE HCL 5 MG PO TABS
5.0000 mg | ORAL_TABLET | ORAL | 0 refills | Status: DC | PRN
  Filled 2023-12-23: qty 30, 5d supply, fill #0

## 2023-12-23 MED ORDER — METHYLPREDNISOLONE ACETATE 40 MG/ML IJ SUSP
INTRAMUSCULAR | Status: AC
  Filled 2023-12-23: qty 1

## 2023-12-23 MED ORDER — CHLORHEXIDINE GLUCONATE 0.12 % MT SOLN
15.0000 mL | Freq: Once | OROMUCOSAL | Status: AC
  Administered 2023-12-23: 15 mL via OROMUCOSAL

## 2023-12-23 MED ORDER — BUPIVACAINE-EPINEPHRINE (PF) 0.5% -1:200000 IJ SOLN
INTRAMUSCULAR | Status: AC
  Filled 2023-12-23: qty 10

## 2023-12-23 MED ORDER — PROPOFOL 1000 MG/100ML IV EMUL
INTRAVENOUS | Status: AC
  Filled 2023-12-23: qty 100

## 2023-12-23 MED ORDER — CEFAZOLIN IN SODIUM CHLORIDE 2-0.9 GM/100ML-% IV SOLN
2.0000 g | Freq: Once | INTRAVENOUS | Status: DC
  Filled 2023-12-23: qty 100

## 2023-12-23 MED ORDER — SODIUM CHLORIDE (PF) 0.9 % IJ SOLN
INTRAMUSCULAR | Status: AC
  Filled 2023-12-23: qty 10

## 2023-12-23 MED ORDER — OXYCODONE HCL 5 MG PO TABS
5.0000 mg | ORAL_TABLET | Freq: Once | ORAL | Status: AC | PRN
  Administered 2023-12-23: 5 mg via ORAL

## 2023-12-23 MED ORDER — CEFAZOLIN SODIUM-DEXTROSE 2-4 GM/100ML-% IV SOLN
INTRAVENOUS | Status: AC
  Filled 2023-12-23: qty 100

## 2023-12-23 MED ORDER — CEFAZOLIN SODIUM-DEXTROSE 2-4 GM/100ML-% IV SOLN
2.0000 g | INTRAVENOUS | Status: AC
  Administered 2023-12-23: 2 g via INTRAVENOUS

## 2023-12-23 SURGICAL SUPPLY — 30 items
BASIN KIT SINGLE STR (MISCELLANEOUS) ×1 IMPLANT
BRUSH SCRUB EZ 4% CHG (MISCELLANEOUS) ×1 IMPLANT
BUR NEURO DRILL SOFT 3.0X3.8M (BURR) ×1 IMPLANT
DERMABOND ADVANCED .7 DNX12 (GAUZE/BANDAGES/DRESSINGS) ×1 IMPLANT
DRAPE C-ARM XRAY 36X54 (DRAPES) ×2 IMPLANT
DRAPE LAPAROTOMY 100X77 ABD (DRAPES) ×1 IMPLANT
DRAPE SPINE LEICA/WILD 54X150 (DRAPES) ×1 IMPLANT
DRSG OPSITE POSTOP 3X4 (GAUZE/BANDAGES/DRESSINGS) ×1 IMPLANT
DRSG TEGADERM 4X4.75 (GAUZE/BANDAGES/DRESSINGS) IMPLANT
ELECTRODE EZSTD 165MM 6.5IN (MISCELLANEOUS) ×1 IMPLANT
ELECTRODE REM PT RTRN 9FT ADLT (ELECTROSURGICAL) ×1 IMPLANT
GLOVE BIOGEL PI IND STRL 7.0 (GLOVE) ×1 IMPLANT
GLOVE BIOGEL PI IND STRL 8 (GLOVE) ×2 IMPLANT
GLOVE SURG SYN 7.0 PF PI (GLOVE) ×1 IMPLANT
GLOVE SURG SYN 7.5 PF PI (GLOVE) ×1 IMPLANT
GOWN SRG XL LVL 3 NONREINFORCE (GOWNS) ×1 IMPLANT
GOWN STRL REUS W/ TWL LRG LVL3 (GOWN DISPOSABLE) ×1 IMPLANT
KIT WILSON FRAME (KITS) ×1 IMPLANT
KNIFE BAYONET SHORT DISCETOMY (MISCELLANEOUS) IMPLANT
NDL SAFETY ECLIPSE 18X1.5 (NEEDLE) ×1 IMPLANT
NS IRRIG 500ML POUR BTL (IV SOLUTION) ×1 IMPLANT
PACK LAMINECTOMY ARMC (PACKS) ×1 IMPLANT
PAD ARMBOARD POSITIONER FOAM (MISCELLANEOUS) ×2 IMPLANT
SURGIFLO W/THROMBIN 8M KIT (HEMOSTASIS) ×1 IMPLANT
SUT STRATA 3-0 15 PS-2 (SUTURE) ×1 IMPLANT
SUT VIC AB 0 CT1 27XCR 8 STRN (SUTURE) ×1 IMPLANT
SUT VIC AB 2-0 CT1 18 (SUTURE) ×1 IMPLANT
SYR 30ML LL (SYRINGE) ×2 IMPLANT
SYR 3ML LL SCALE MARK (SYRINGE) ×1 IMPLANT
TRAP FLUID SMOKE EVACUATOR (MISCELLANEOUS) ×1 IMPLANT

## 2023-12-23 NOTE — Anesthesia Postprocedure Evaluation (Signed)
 Anesthesia Post Note  [name]  Procedure(s) Performed: Lumbar Laminectomy and Discectomy, L5/S1, left Sided (Left: Spine Lumbar)  Patient location during evaluation: PACU Anesthesia Type: General Level of consciousness: awake and alert Pain management: pain level controlled Vital Signs Assessment: post-procedure vital signs reviewed and stable Respiratory status: spontaneous breathing, nonlabored ventilation, respiratory function stable and patient connected to nasal cannula oxygen Cardiovascular status: blood pressure returned to baseline and stable Postop Assessment: no apparent nausea or vomiting Anesthetic complications: no   No notable events documented.   Last Vitals:  Vitals:   12/23/23 0945 12/23/23 1000  BP: 113/74 104/75  Pulse: 86 92  Resp: 13 13  Temp:    SpO2: 100% 99%    Last Pain:  Vitals:   12/23/23 0959  TempSrc:   PainSc: 4                  [name]

## 2023-12-23 NOTE — Progress Notes (Signed)
 Patient walked the nursing unit twice, voided x 1, and was able tolerate food.

## 2023-12-23 NOTE — H&P (View-Only) (Signed)
 Referring Physician:  No referring provider defined for this encounter.  Primary Physician:  Delinda Jinnie Jansky, CNM  History of Present Illness: 12/23/2023 [name] is here today with a chief complaint of left-sided lumbosacral radiculopathy.  She has had a chronic back pain and has been managed conservatively.  She remains quite active and has been otherwise healthy, she continues to actively train horses.  In June of this year early in the week she had severe low back pain, this caused severe pain in her left lower extremity with radiation down her back of her lower extremity to the side of her foot.  She originally had burning pain and tingling, but since has had steroid treatments and injections since 09/02/2023.  She does have weakness and has had a impact on her quality of life as well as her ability to function as she noticed that she is having difficulty pushing up off the ground with her left foot.  She also has noticed that she has been limping with her stride.  Conservative measures: chiropractor Physical therapy: No formal physical therapy Multimodal medical therapy including regular antiinflammatories:  Celebrex, Tizanidine , Gabapentin, Prednisone, Ibuprofen, Naproxen Injections:  03/10/2023-Large Joint Injection: L greater trochanteric bursa  10/27/2023: Left S1 transforaminal ESI   Past Surgery: no spine surgery  The symptoms are causing a significant impact on the patient's life and her weakness is impairing her function  I have utilized the care everywhere function in epic to review the outside records available from external health systems.  Recently reviewed her orthopedic surgery evaluation which at that time demonstrated full strength to confrontation.  Review of Systems:  A 10 point review of systems is negative, except for the pertinent positives and negatives detailed in the HPI.  Past Medical History: Past Medical History:  Diagnosis Date    Arthritis    Chronic back pain    Family history of adverse reaction to anesthesia    Mom woke up voilent and n/v    Past Surgical History: Past Surgical History:  Procedure Laterality Date   dental implant     lasik eye surgery x 4  Bilateral    revisions x 3 only in right eye   TONSILECTOMY, ADENOIDECTOMY, BILATERAL MYRINGOTOMY AND TUBES     TONSILLECTOMY     17 months old    Allergies: Allergies as of 11/12/2023 - Review Complete 11/12/2023  Allergen Reaction Noted   Biotin Hives 11/12/2023    Medications:  Current Facility-Administered Medications:    ceFAZolin  (ANCEF ) IVPB 2g/100 mL premix, 2 g, Intravenous, 60 min Pre-Op, Claudene, Penne ORN, MD   lactated ringers  infusion, , Intravenous, Continuous, Mazzoni, Andrea, MD  Social History: Social History   Tobacco Use   Smoking status: Former    Types: Cigarettes   Smokeless tobacco: Never  Vaping Use   Vaping status: Never Used  Substance Use Topics   Alcohol use: Yes    Alcohol/week: 2.0 standard drinks of alcohol    Types: 1 Glasses of wine, 1 Cans of beer per week    Comment: 1 x week   Drug use: No    Family Medical History: Family History  Problem Relation Age of Onset   Skin cancer Father 77   Cancer Paternal Aunt 79   Colon cancer Paternal Grandmother 6   Breast cancer Mother 24       passed at 86    Physical Examination: Vitals:   12/23/23 0617  BP: 114/67  Pulse: 80  Resp: 18  Temp: (!) 97.1 F (36.2 C)  SpO2: 100%    General: Patient is in no apparent distress. Attention to examination is appropriate.  Neck:   Supple.  Full range of motion.  Respiratory: Patient is breathing without any difficulty.   NEUROLOGICAL:     Awake, alert, oriented to person, place, and time.  Speech is clear and fluent.   Cranial Nerves: Pupils equal round and reactive to light.  Facial tone is symmetric.  Facial sensation is symmetric. Shoulder shrug is symmetric. Tongue protrusion is midline.     Strength:  Side Iliopsoas Quads Hamstring PF DF EHL  R 5 5 5 5 5 5   L 5 5 4+ 2-3* 5 5   Reflexes are 2+ at the bilateral patellas, 2+ at the right medial hamstring, 1+ at the left medial hamstring, 2+ at the right Achilles, absent at the left Achilles  No major sensory deficit noted     Gait is abnormal, has a decreased pushoff noted on the left foot, also has evidence of hip drop when trying to stand on her left foot alone.  Trendelenburg sign positive.  Imaging: Narrative & Impression  EXAM DESCRIPTION: MR LUMBAR SPINE WO CONTRAST   CLINICAL HISTORY: Back pain.   COMPARISON: None Available.   TECHNIQUE: MRI of the lumbar spine is performed according to our usual protocol with axial and sagittal multi sequence imaging.   FINDINGS: The alignment is unremarkable. Mild degenerative disc disease L3-4 and L4-5. No endplate marrow edema. The marrow signal is unremarkable as well as the conus and cauda equina.   L3-4 has mild lateral recess narrowing on the right due to broad-based disc bulge and facet hypertrophy.   L4-5 has mild foraminal and lateral recess narrowing on the right due to broad-based disc bulge and facet hypertrophy.   L5-S1 has a left paracentral disc extrusion migrating cranially behind the inferior endplate of L5. This causes moderate to severe lateral recess narrowing on the left with suspected impingement of the left S1 nerve. There is a small left proximal foraminal component contributing to mild-to-moderate foraminal narrowing as well a least crowding the exiting L5 nerve.   The imaged levels are otherwise unremarkable.   The imaged retroperitoneal structures are unremarkable.     IMPRESSION: L5-S1 has a left paracentral disc extrusion migrating cranially behind the inferior endplate of L5. Suspected impingement of the left S1 nerve. There is a small proximal left foraminal component with possible contact of the exiting L5 nerve.    Electronically signed by: Reyes Frees MD 10/03/2023 12:37 AM EDT RP Workstation: [id number]   I have personally reviewed the images and agree with the above interpretation.  Medical Decision Making/Assessment and Plan: [name] is a pleasant 58 y.o. female with a left-sided lumbosacral radiculopathy.  She has had this since early June 2025.  Developed severe pain numbness and tingling in her left buttocks region down her left lower extremity.  She has had chronic back pain but this was new.  She unfortunately has developed weakness in her left lower extremity.  Her sensation has been mostly intact.  Her pain has been better controlled with oral steroids as well as injection steroids.  Her pain can still get up to 8-9 out of 10 intermittently.  Physical examination she is unable to support her body weight in her left lower extremity.  She does not have full antigravity strength in her left plantarflexion.  Her toe flexion is also 4- out of 5  on the left.  She has an absent left Achilles reflex.  Positive straight leg raise.  Positive Trendelenburg sign.  She has weakness to confrontation as well in her plantarflexion.  Her MRI demonstrates a large left-sided paracentral disc herniation at L5-S1 with some cranial migration compressing the S1 nerve root.  This clearly correlates with her clinical symptomatology and demonstrates a S1 radiculopathy with progressive weakness, she was not seen to have weakness at her orthopedics visit 2 weeks ago and now clearly cannot toe walk or support her weight on her left foot.  In summary she has a symptomatic left-sided lumbosacral radiculopathy with weakness, loss of Achilles reflex, gait abnormality including positive Trendelenburg sign with a hip drop and functional impairment.  Because of this we are recommending a left-sided L5-S1 microdiscectomy.  .  We discussed risk and benefits of surgery, we also discussed that given the significant weakness noted on  physical examination that she may not regain full strength after surgery.  We discussed that she would need to restrict and hold off from her horseback riding in the postoperative period.  Thank you for involving me in the care of this patient.    Penne MICAEL Sharps MD/MSCR Neurosurgery

## 2023-12-23 NOTE — Anesthesia Procedure Notes (Signed)
 Procedure Name: Intubation Date/Time: 12/23/2023 7:31 AM  Performed by: Brien Sotero PARAS, CRNAPre-anesthesia Checklist: Patient identified, Patient being monitored, Timeout performed, Emergency Drugs available and Suction available Patient Re-evaluated:Patient Re-evaluated prior to induction Oxygen Delivery Method: Circle system utilized Preoxygenation: Pre-oxygenation with 100% oxygen Induction Type: IV induction Ventilation: Mask ventilation without difficulty Laryngoscope Size: 3 and McGrath Grade View: Grade I Tube type: Oral Tube size: 7.0 mm Number of attempts: 1 Airway Equipment and Method: Stylet Placement Confirmation: ETT inserted through vocal cords under direct vision, positive ETCO2 and breath sounds checked- equal and bilateral Secured at: 21 cm Tube secured with: Tape Dental Injury: Teeth and Oropharynx as per pre-operative assessment

## 2023-12-23 NOTE — Op Note (Signed)
 Indications: [name] is suffering from lumbar radiculopathy. The patient tried and failed conservative management, prompting surgical intervention.  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   Preoperative Note:   Risks of surgery discussed include: infection, bleeding, stroke, coma, death, paralysis, CSF leak, nerve/spinal cord injury, numbness, tingling, weakness, complex regional pain syndrome, recurrent stenosis and/or disc herniation, vascular injury, development of instability, neck/back pain, need for further surgery, persistent symptoms, development of deformity, and the risks of anesthesia. The patient understood these risks and agreed to proceed.  Operative Note:   1) left L L5/S1 microdiscectomy  The patient was then brought from the preoperative center with intravenous access established.  The patient underwent general anesthesia and endotracheal tube intubation, and was then rotated on the [place] rail top where all pressure points were appropriately padded.  The skin was then thoroughly cleansed.  Perioperative antibiotic prophylaxis was administered.  Sterile prep and drapes were then applied and a timeout was then observed.  C-arm was brought into the field under sterile conditions, and the L L5-S1 disc space identified and marked with an incision on the left 1cm lateral to midline.  Once this was complete a 2 cm incision was opened with the use of a #10 blade knife.  The Metrx tubes were sequentially advanced under lateral fluoroscopy until a 18 x 50 mm  Metrx tube was placed over the facet and lamina and secured to the bed.    The microscope was then sterilely brought into the field and muscle creep was hemostased with a bipolar and resected with a pituitary rongeur.  A Bovie extender was then used to expose the spinous process and lamina.  Careful attention was placed to not violate the facet capsule. A 3 mm matchstick drill bit was then used to make a hemi-laminotomy trough until the ligamentum flavum was exposed.  This was extended to the base of the spinous process.  Once this was complete and the underlying ligamentum flavum was visualized, the ligamentum was dissected with an up angle curette and resected with a #2 and #3 mm biting Kerrison.  The laminotomy opening was also expanded in similar fashion and hemostasis was obtained with Surgifoam and a patty as well as bone wax.  The rostral aspect of the caudal level of the lamina was also resected with a #2 biting Kerrison effort to further enhance exposure.  Once the underlying dura was visualized a Penfield 4 was then used to dissect and expose the traversing nerve root.  The nerve root was very adherent to the disc herniation.  The disc herniation itself was compressing the nerve root from deep and causing lateral displacement of the dura.  We attempted to go to see if we could isolate the nerve from the axilla of the nerve root we are able to see the disc herniation but we could not make a safe cord or.  We extended our dissection superiorly until we got to the lateral aspect of the dura.  Again the disc herniation was quite adherent to the traversing nerve root.  We slowly dissected this medially.  We are able to identify it.  We then were able to resect  the disc herniation.  After resection the nerve was much less tense.  The venous plexus was hemostased with Surgifoam and light bipolar use.  A small penfied was then used to make a small annulotomy within the disc space and disc space contents were  noted to come through the annulus.   Once the thecal sac and nerve root were noted to be relaxed and under less tension the ball-tipped feeler was passed along the foramen distally to ensure no residual compression was noted.    Depo-Medrol  was placed along the nerve root.  The area was irrigated. The tube system was then removed under microscopic visualization and hemostasis was obtained with a bipolar.    The fascial layer was reapproximated with the use of a 0- Vicryl suture.  Subcutaneous tissue layer was reapproximated using 2-0 Vicryl suture.  3-0 monocryl was used on the skin. The skin was then cleansed and Dermabond was used to close the skin opening.  Patient was then rotated back to the preoperative bed awakened from anesthesia and taken to recovery all counts are correct in this case.   I performed the entire procedure with the assistance of  Edsel Goods, PA-C as an Designer, television/film set. An assistant was required for this procedure due to the complexity.  The assistant provided assistance in tissue manipulation and suction, closure and was required for the successful and safe performance of the procedure. I performed the critical portions of the procedure.   [name], MD [facility]

## 2023-12-23 NOTE — Anesthesia Preprocedure Evaluation (Signed)
 Anesthesia Evaluation  Patient identified by MRN, date of birth, ID band Patient awake    Reviewed: Allergy & Precautions, NPO status , Patient's Chart, lab work & pertinent test results  History of Anesthesia Complications Negative for: history of anesthetic complications  Airway Mallampati: II  TM Distance: >3 FB Neck ROM: full    Dental no notable dental hx.    Pulmonary neg pulmonary ROS, former smoker   Pulmonary exam normal        Cardiovascular negative cardio ROS Normal cardiovascular exam     Neuro/Psych  Neuromuscular disease  negative psych ROS   GI/Hepatic negative GI ROS, Neg liver ROS,,,  Endo/Other  negative endocrine ROS    Renal/GU negative Renal ROS  negative genitourinary   Musculoskeletal   Abdominal   Peds  Hematology negative hematology ROS (+)   Anesthesia Other Findings Past Medical History: No date: Arthritis No date: Chronic back pain No date: Family history of adverse reaction to anesthesia     Comment:  Mom woke up voilent and n/v  Past Surgical History: No date: dental implant No date: lasik eye surgery x 4 ; Bilateral     Comment:  revisions x 3 only in right eye No date: TONSILECTOMY, ADENOIDECTOMY, BILATERAL MYRINGOTOMY AND TUBES No date: TONSILLECTOMY     Comment:  17 months old  BMI    Body Mass Index: 22.50 kg/m      Reproductive/Obstetrics negative OB ROS                              Anesthesia Physical Anesthesia Plan  ASA: 2  Anesthesia Plan: General ETT   Post-op Pain Management: Ofirmev  IV (intra-op)*, Toradol  IV (intra-op)*, Dilaudid  IV and Ketamine IV*   Induction: Intravenous  PONV Risk Score and Plan: Ondansetron , Dexamethasone , Midazolam , Treatment may vary due to age or medical condition, TIVA and Propofol  infusion  Airway Management Planned: Oral ETT  Additional Equipment:   Intra-op Plan:   Post-operative Plan:  Extubation in OR  Informed Consent: I have reviewed the patients History and Physical, chart, labs and discussed the procedure including the risks, benefits and alternatives for the proposed anesthesia with the patient or authorized representative who has indicated his/her understanding and acceptance.     Dental Advisory Given  Plan Discussed with: Anesthesiologist, CRNA and Surgeon  Anesthesia Plan Comments: (Patient consented for risks of anesthesia including but not limited to:  - adverse reactions to medications - damage to eyes, teeth, lips or other oral mucosa - nerve damage due to positioning  - sore throat or hoarseness - Damage to heart, brain, nerves, lungs, other parts of body or loss of life  Patient voiced understanding and assent.)        Anesthesia Quick Evaluation

## 2023-12-23 NOTE — Progress Notes (Signed)
 Referring Physician:  No referring provider defined for this encounter.  Primary Physician:  Delinda Jinnie Jansky, CNM  History of Present Illness: 12/23/2023 Angela Humphrey is here today with a chief complaint of left-sided lumbosacral radiculopathy.  She has had a chronic back pain and has been managed conservatively.  She remains quite active and has been otherwise healthy, she continues to actively train horses.  In June of this year early in the week she had severe low back pain, this caused severe pain in her left lower extremity with radiation down her back of her lower extremity to the side of her foot.  She originally had burning pain and tingling, but since has had steroid treatments and injections since 09/02/2023.  She does have weakness and has had a impact on her quality of life as well as her ability to function as she noticed that she is having difficulty pushing up off the ground with her left foot.  She also has noticed that she has been limping with her stride.  Conservative measures: chiropractor Physical therapy: No formal physical therapy Multimodal medical therapy including regular antiinflammatories:  Celebrex, Tizanidine , Gabapentin, Prednisone, Ibuprofen, Naproxen Injections:  03/10/2023-Large Joint Injection: L greater trochanteric bursa  10/27/2023: Left S1 transforaminal ESI   Past Surgery: no spine surgery  The symptoms are causing a significant impact on the patient's life and her weakness is impairing her function  I have utilized the care everywhere function in epic to review the outside records available from external health systems.  Recently reviewed her orthopedic surgery evaluation which at that time demonstrated full strength to confrontation.  Review of Systems:  A 10 point review of systems is negative, except for the pertinent positives and negatives detailed in the HPI.  Past Medical History: Past Medical History:  Diagnosis Date    Arthritis    Chronic back pain    Family history of adverse reaction to anesthesia    Mom woke up voilent and n/v    Past Surgical History: Past Surgical History:  Procedure Laterality Date   dental implant     lasik eye surgery x 4  Bilateral    revisions x 3 only in right eye   TONSILECTOMY, ADENOIDECTOMY, BILATERAL MYRINGOTOMY AND TUBES     TONSILLECTOMY     17 months old    Allergies: Allergies as of 11/12/2023 - Review Complete 11/12/2023  Allergen Reaction Noted   Biotin Hives 11/12/2023    Medications:  Current Facility-Administered Medications:    ceFAZolin  (ANCEF ) IVPB 2g/100 mL premix, 2 g, Intravenous, 60 min Pre-Op, Angela Humphrey, Angela ORN, MD   lactated ringers  infusion, , Intravenous, Continuous, Mazzoni, Andrea, MD  Social History: Social History   Tobacco Use   Smoking status: Former    Types: Cigarettes   Smokeless tobacco: Never  Vaping Use   Vaping status: Never Used  Substance Use Topics   Alcohol use: Yes    Alcohol/week: 2.0 standard drinks of alcohol    Types: 1 Glasses of wine, 1 Cans of beer per week    Comment: 1 x week   Drug use: No    Family Medical History: Family History  Problem Relation Age of Onset   Skin cancer Father 77   Cancer Paternal Aunt 79   Colon cancer Paternal Grandmother 6   Breast cancer Mother 24       passed at 86    Physical Examination: Vitals:   12/23/23 0617  BP: 114/67  Pulse: 80  Resp: 18  Temp: (!) 97.1 F (36.2 C)  SpO2: 100%    General: Patient is in no apparent distress. Attention to examination is appropriate.  Neck:   Supple.  Full range of motion.  Respiratory: Patient is breathing without any difficulty.   NEUROLOGICAL:     Awake, alert, oriented to person, place, and time.  Speech is clear and fluent.   Cranial Nerves: Pupils equal round and reactive to light.  Facial tone is symmetric.  Facial sensation is symmetric. Shoulder shrug is symmetric. Tongue protrusion is midline.     Strength:  Side Iliopsoas Quads Hamstring PF DF EHL  R 5 5 5 5 5 5   L 5 5 4+ 2-3* 5 5   Reflexes are 2+ at the bilateral patellas, 2+ at the right medial hamstring, 1+ at the left medial hamstring, 2+ at the right Achilles, absent at the left Achilles  No major sensory deficit noted     Gait is abnormal, has a decreased pushoff noted on the left foot, also has evidence of hip drop when trying to stand on her left foot alone.  Trendelenburg sign positive.  Imaging: Narrative & Impression  EXAM DESCRIPTION: MR LUMBAR SPINE WO CONTRAST   CLINICAL HISTORY: Back pain.   COMPARISON: None Available.   TECHNIQUE: MRI of the lumbar spine is performed according to our usual protocol with axial and sagittal multi sequence imaging.   FINDINGS: The alignment is unremarkable. Mild degenerative disc disease L3-4 and L4-5. No endplate marrow edema. The marrow signal is unremarkable as well as the conus and cauda equina.   L3-4 has mild lateral recess narrowing on the right due to broad-based disc bulge and facet hypertrophy.   L4-5 has mild foraminal and lateral recess narrowing on the right due to broad-based disc bulge and facet hypertrophy.   L5-S1 has a left paracentral disc extrusion migrating cranially behind the inferior endplate of L5. This causes moderate to severe lateral recess narrowing on the left with suspected impingement of the left S1 nerve. There is a small left proximal foraminal component contributing to mild-to-moderate foraminal narrowing as well a least crowding the exiting L5 nerve.   The imaged levels are otherwise unremarkable.   The imaged retroperitoneal structures are unremarkable.     IMPRESSION: L5-S1 has a left paracentral disc extrusion migrating cranially behind the inferior endplate of L5. Suspected impingement of the left S1 nerve. There is a small proximal left foraminal component with possible contact of the exiting L5 nerve.    Electronically signed by: Reyes Frees MD 10/03/2023 12:37 AM EDT RP Workstation: [id number]   I have personally reviewed the images and agree with the above interpretation.  Medical Decision Making/Assessment and Plan: Ms. Strassman is a pleasant 58 y.o. female with a left-sided lumbosacral radiculopathy.  She has had this since early June 2025.  Developed severe pain numbness and tingling in her left buttocks region down her left lower extremity.  She has had chronic back pain but this was new.  She unfortunately has developed weakness in her left lower extremity.  Her sensation has been mostly intact.  Her pain has been better controlled with oral steroids as well as injection steroids.  Her pain can still get up to 8-9 out of 10 intermittently.  Physical examination she is unable to support her body weight in her left lower extremity.  She does not have full antigravity strength in her left plantarflexion.  Her toe flexion is also 4- out of 5  on the left.  She has an absent left Achilles reflex.  Positive straight leg raise.  Positive Trendelenburg sign.  She has weakness to confrontation as well in her plantarflexion.  Her MRI demonstrates a large left-sided paracentral disc herniation at L5-S1 with some cranial migration compressing the S1 nerve root.  This clearly correlates with her clinical symptomatology and demonstrates a S1 radiculopathy with progressive weakness, she was not seen to have weakness at her orthopedics visit 2 weeks ago and now clearly cannot toe walk or support her weight on her left foot.  In summary she has a symptomatic left-sided lumbosacral radiculopathy with weakness, loss of Achilles reflex, gait abnormality including positive Trendelenburg sign with a hip drop and functional impairment.  Because of this we are recommending a left-sided L5-S1 microdiscectomy.  .  We discussed risk and benefits of surgery, we also discussed that given the significant weakness noted on  physical examination that she may not regain full strength after surgery.  We discussed that she would need to restrict and hold off from her horseback riding in the postoperative period.  Thank you for involving me in the care of this patient.    Angela MICAEL Sharps MD/MSCR Neurosurgery

## 2023-12-23 NOTE — Telephone Encounter (Signed)
 Patient had surgery this morning and is calling to find out if she is able to lay on her side some or does she need top remain lying on her back?

## 2023-12-23 NOTE — Discharge Instructions (Addendum)

## 2023-12-23 NOTE — Telephone Encounter (Signed)
 I spoke with the patient and informed her she can sleep in whatever position is comfortable.

## 2023-12-23 NOTE — Transfer of Care (Signed)
 Immediate Anesthesia Transfer of Care Note  [name]  Procedure(s) Performed: Lumbar Laminectomy and Discectomy, L5/S1, left Sided (Left: Spine Lumbar)  Patient Location: PACU  Anesthesia Type:General  Level of Consciousness: awake and oriented  Airway & Oxygen Therapy: Patient Spontanous Breathing  Post-op Assessment: Report given to RN and Post -op Vital signs reviewed and stable  Post vital signs: Reviewed and stable  Last Vitals:  Vitals Value Taken Time  BP 111/76 12/23/23 09:30  Temp    Pulse 88 12/23/23 09:33  Resp 28 12/23/23 09:33  SpO2 98 % 12/23/23 09:33  Vitals shown include unfiled device data.  Last Pain:  Vitals:   12/23/23 0617  TempSrc: Temporal  PainSc: 2          Complications: No notable events documented.

## 2023-12-23 NOTE — Interval H&P Note (Signed)
 History and Physical Interval Note:  12/23/2023 7:12 AM  Angela Humphrey  has presented today for surgery, with the diagnosis of Lumbar Radiculopathy secondary to lumbar disk herniation, Left L5/S1.  The various methods of treatment have been discussed with the patient and family. After consideration of risks, benefits and other options for treatment, the patient has consented to  Procedure(s): Lumbar Laminectomy and Discectomy, L5/S1, left Sided (Left) as a surgical intervention.  The patient's history has been reviewed, patient examined, no change in status, stable for surgery.  I have reviewed the patient's chart and labs.  Questions were answered to the patient's satisfaction.    Heart and lungs clear   [name]

## 2023-12-24 ENCOUNTER — Encounter: Payer: Self-pay | Admitting: Neurosurgery

## 2023-12-25 ENCOUNTER — Encounter: Payer: Self-pay | Admitting: Neurosurgery

## 2024-01-05 ENCOUNTER — Ambulatory Visit (INDEPENDENT_AMBULATORY_CARE_PROVIDER_SITE_OTHER): Admitting: Physician Assistant

## 2024-01-05 ENCOUNTER — Encounter: Payer: Self-pay | Admitting: Physician Assistant

## 2024-01-05 VITALS — BP 112/78 | Temp 98.1°F | Wt 143.6 lb

## 2024-01-05 DIAGNOSIS — M5116 Intervertebral disc disorders with radiculopathy, lumbar region: Secondary | ICD-10-CM

## 2024-01-05 DIAGNOSIS — M5416 Radiculopathy, lumbar region: Secondary | ICD-10-CM

## 2024-01-05 DIAGNOSIS — Z09 Encounter for follow-up examination after completed treatment for conditions other than malignant neoplasm: Secondary | ICD-10-CM

## 2024-01-05 MED ORDER — OXYCODONE HCL 5 MG PO TABS: 5.0000 mg | ORAL_TABLET | ORAL | 0 refills | Status: AC | PRN

## 2024-01-05 NOTE — Progress Notes (Signed)
   REFERRING PHYSICIAN:  Delinda Jinnie Jansky, Cnm [address] [place],  [place] [address]  DOS: 12/23/23, L L5-S1 laminectomy and discectomy  HISTORY OF PRESENT ILLNESS: Angela Humphrey is approximately 2 weeks status post left sided L5-S1 lami. she is doing well.  Her pain is under control.  She does continue to have some weakness and numbness in her left foot as expected.    PHYSICAL EXAMINATION:  General: Patient is well developed, well nourished, calm, collected, and in no apparent distress.   NEUROLOGICAL:  General: In no acute distress.   Awake, alert, oriented to person, place, and time.  Pupils equal round and reactive to light.  Facial tone is symmetric.     Strength:            Side Iliopsoas Quads Hamstring PF DF EHL  R 5 5 5 5 5 5   L 5 5 5  2-3 5 5    Incision c/d/i   ROS (Neurologic):  Negative except as noted above  IMAGING: No new imaging  ASSESSMENT/PLAN:  Angela Humphrey is doing well approximately 2 weeks after left-sided L5-S1 discectomy. she will follow up in approximately 1 month for 6-week postop visit.I have advised the patient to lift up to 10 pounds until 6 weeks after surgery, then increase up to 25 pounds until 12 weeks after surgery.  After 12 weeks post-op, the patient advised to increase activity as tolerated.  Advised to contact the office if any questions or concerns arise.  Lyle Decamp PA-C Department of neurosurgery

## 2024-02-02 ENCOUNTER — Encounter: Admitting: Neurosurgery

## 2024-02-04 ENCOUNTER — Ambulatory Visit: Admitting: Neurosurgery

## 2024-02-04 ENCOUNTER — Encounter: Payer: Self-pay | Admitting: Neurosurgery

## 2024-02-04 VITALS — BP 98/62 | Ht 65.5 in | Wt 143.0 lb

## 2024-02-04 DIAGNOSIS — Z09 Encounter for follow-up examination after completed treatment for conditions other than malignant neoplasm: Secondary | ICD-10-CM

## 2024-02-04 DIAGNOSIS — M5416 Radiculopathy, lumbar region: Secondary | ICD-10-CM

## 2024-02-04 DIAGNOSIS — M5116 Intervertebral disc disorders with radiculopathy, lumbar region: Secondary | ICD-10-CM

## 2024-02-04 NOTE — Progress Notes (Signed)
   REFERRING PHYSICIAN:  Delinda Jinnie Jansky, Cnm [address] [place],  [place] [address]  DOS: 12/23/23, L L5-S1 laminectomy and discectomy  HISTORY OF PRESENT ILLNESS: Angela Humphrey is approximately 6 weeks status post left sided L5-S1 lami.  She is doing very well.  Her pain has been minimal.  Her strength is improving.  She has noticed an improvement in her gait.  She is also noticed an improvement in her sensation.  PHYSICAL EXAMINATION:  General: Patient is well developed, well nourished, calm, collected, and in no apparent distress.   NEUROLOGICAL:  General: In no acute distress.   Awake, alert, oriented to person, place, and time.  Pupils equal round and reactive to light.  Facial tone is symmetric.     Strength:            Side Iliopsoas Quads Hamstring PF DF EHL  R 5 5 5 5 5 5   L 5 5 5 3 5 5    Incision c/d/i   ROS (Neurologic):  Negative except as noted above  IMAGING: No new imaging  ASSESSMENT/PLAN:  Angela Humphrey is doing well approximately 6 weeks after left-sided L5-S1 discectomy.  increase up to 25 pounds until 12 weeks after surgery.  After 12 weeks post-op, the patient advised to increase activity as tolerated.  We did discuss that she could consider working with physical therapy to help with her recovery as she did have some significant deficits preoperatively and is having improvements at this time  Advised to contact the office if any questions or concerns arise.  Penne MICAEL Sharps, MD Department of neurosurgery

## 2024-02-17 ENCOUNTER — Encounter: Payer: Self-pay | Admitting: Neurosurgery

## 2024-03-10 ENCOUNTER — Ambulatory Visit: Admitting: Physician Assistant

## 2024-03-10 ENCOUNTER — Encounter: Payer: Self-pay | Admitting: Physician Assistant

## 2024-03-10 VITALS — BP 118/76 | Wt 143.0 lb

## 2024-03-10 DIAGNOSIS — M5106 Intervertebral disc disorders with myelopathy, lumbar region: Secondary | ICD-10-CM

## 2024-03-10 DIAGNOSIS — M5116 Intervertebral disc disorders with radiculopathy, lumbar region: Secondary | ICD-10-CM

## 2024-03-10 DIAGNOSIS — Z09 Encounter for follow-up examination after completed treatment for conditions other than malignant neoplasm: Secondary | ICD-10-CM

## 2024-03-10 NOTE — Progress Notes (Unsigned)
° °  REFERRING PHYSICIAN:  Delinda Jinnie Jansky, Cnm [address] [place],  [place] [address]  DOS: 12/23/23, L L5-S1 laminectomy and discectomy  HISTORY OF PRESENT ILLNESS: Mystic Suazo is approximately 6 weeks status post left sided L5-S1 lami.  She is doing very well.  Her pain has been minimal.  Her strength is improving.  She has noticed an improvement in her gait.  She is also noticed an improvement in her sensation.    Had flare with pain from butt to back of knee.   PHYSICAL EXAMINATION:  General: Patient is well developed, well nourished, calm, collected, and in no apparent distress.   NEUROLOGICAL:  General: In no acute distress.   Awake, alert, oriented to person, place, and time.  Pupils equal round and reactive to light.  Facial tone is symmetric.     Strength:            Side Iliopsoas Quads Hamstring PF DF EHL  R 5 5 5 5 5 5   L 5 5 5 3 5 5    Incision c/d/i   ROS (Neurologic):  Negative except as noted above  IMAGING: No new imaging  ASSESSMENT/PLAN:  Sherylann Vangorden is doing well approximately 6 weeks after left-sided L5-S1 discectomy.  increase up to 25 pounds until 12 weeks after surgery.  After 12 weeks post-op, the patient advised to increase activity as tolerated.  We did discuss that she could consider working with physical therapy to help with her recovery as she did have some significant deficits preoperatively and is having improvements at this time  Advised to contact the office if any questions or concerns arise.  Penne MICAEL Sharps, MD Department of neurosurgery

## 2024-04-21 ENCOUNTER — Other Ambulatory Visit: Payer: Self-pay

## 2024-04-21 ENCOUNTER — Encounter (HOSPITAL_COMMUNITY): Payer: Self-pay

## 2024-04-21 ENCOUNTER — Ambulatory Visit (HOSPITAL_COMMUNITY): Attending: Physician Assistant

## 2024-04-21 DIAGNOSIS — M5459 Other low back pain: Secondary | ICD-10-CM | POA: Insufficient documentation

## 2024-04-21 DIAGNOSIS — M5106 Intervertebral disc disorders with myelopathy, lumbar region: Secondary | ICD-10-CM | POA: Diagnosis not present

## 2024-04-21 DIAGNOSIS — Z7409 Other reduced mobility: Secondary | ICD-10-CM | POA: Diagnosis present

## 2024-04-23 ENCOUNTER — Telehealth: Payer: Self-pay | Admitting: Neurosurgery

## 2024-04-23 ENCOUNTER — Other Ambulatory Visit: Payer: Self-pay | Admitting: Physician Assistant

## 2024-04-23 ENCOUNTER — Ambulatory Visit

## 2024-04-23 DIAGNOSIS — M5106 Intervertebral disc disorders with myelopathy, lumbar region: Secondary | ICD-10-CM

## 2024-04-23 DIAGNOSIS — M545 Low back pain, unspecified: Secondary | ICD-10-CM | POA: Diagnosis not present

## 2024-04-23 MED ORDER — METHYLPREDNISOLONE 4 MG PO TBPK: ORAL_TABLET | ORAL | 0 refills | Status: AC

## 2024-04-23 NOTE — Telephone Encounter (Signed)
 Pt inquired about rx being sent.

## 2024-04-23 NOTE — Telephone Encounter (Signed)
 Patient advised on voicemail.

## 2024-04-23 NOTE — Telephone Encounter (Signed)
 The patient reports that she bent down to pet a cat and immediately felt as though she threw out her back. She describes a painful, acorn-sized area in the middle of her back with surrounding discomfort. She has limited mobility and rotation but is able to walk, though she feels the pain with each step. She denies numbness or tingling and reports the pain as a constant throbbing. She is requesting an X-ray to determine whether there is an underlying issue or if she may need an injection.

## 2024-04-23 NOTE — Telephone Encounter (Signed)
 Patient advised and is scheduled to come in today for Xray. She would like to get steroid please, pharmacy [facility] [address]

## 2024-05-05 ENCOUNTER — Ambulatory Visit (HOSPITAL_COMMUNITY)

## 2024-05-11 ENCOUNTER — Ambulatory Visit (HOSPITAL_COMMUNITY)

## 2024-05-13 ENCOUNTER — Ambulatory Visit (HOSPITAL_COMMUNITY)

## 2024-05-18 ENCOUNTER — Ambulatory Visit (HOSPITAL_COMMUNITY): Admitting: Physical Therapy

## 2024-05-20 ENCOUNTER — Ambulatory Visit (HOSPITAL_COMMUNITY)

## 2024-05-26 ENCOUNTER — Ambulatory Visit (HOSPITAL_COMMUNITY)

## 2024-06-02 ENCOUNTER — Ambulatory Visit (HOSPITAL_COMMUNITY)

## 2024-06-09 ENCOUNTER — Ambulatory Visit (HOSPITAL_COMMUNITY)
